# Patient Record
Sex: Male | Born: 1960 | Race: Black or African American | Hispanic: No | Smoking: Current every day smoker
Health system: Southern US, Community
[De-identification: ages and names within clinical notes are randomized; demographics above are authoritative.]

## PROBLEM LIST (undated history)

## (undated) DIAGNOSIS — L97414 Non-pressure chronic ulcer of right heel and midfoot with necrosis of bone: Secondary | ICD-10-CM

## (undated) DIAGNOSIS — F4310 Post-traumatic stress disorder, unspecified: Secondary | ICD-10-CM

## (undated) DIAGNOSIS — M869 Osteomyelitis, unspecified: Secondary | ICD-10-CM

## (undated) DIAGNOSIS — I739 Peripheral vascular disease, unspecified: Secondary | ICD-10-CM

## (undated) DIAGNOSIS — M1990 Unspecified osteoarthritis, unspecified site: Secondary | ICD-10-CM

## (undated) DIAGNOSIS — I10 Essential (primary) hypertension: Secondary | ICD-10-CM

## (undated) DIAGNOSIS — I70234 Atherosclerosis of native arteries of right leg with ulceration of heel and midfoot: Secondary | ICD-10-CM

## (undated) HISTORY — DX: Atherosclerosis of native arteries of right leg with ulceration of heel and midfoot: I70.234

## (undated) HISTORY — DX: Post-traumatic stress disorder, unspecified: F43.10

## (undated) HISTORY — DX: Non-pressure chronic ulcer of right heel and midfoot with necrosis of bone: L97.414

## (undated) HISTORY — DX: Peripheral vascular disease, unspecified: I73.9

## (undated) HISTORY — PX: MULTIPLE TOOTH EXTRACTIONS: SHX2053

## (undated) HISTORY — PX: OTHER SURGICAL HISTORY: SHX169

---

## 2009-05-21 ENCOUNTER — Ambulatory Visit: Payer: Self-pay | Admitting: Internal Medicine

## 2009-05-21 ENCOUNTER — Encounter (INDEPENDENT_AMBULATORY_CARE_PROVIDER_SITE_OTHER): Payer: Self-pay | Admitting: Adult Health

## 2009-05-21 LAB — CONVERTED CEMR LAB
ALT: 26 units/L (ref 0–53)
AST: 26 units/L (ref 0–37)
Albumin: 4.4 g/dL (ref 3.5–5.2)
Alkaline Phosphatase: 86 units/L (ref 39–117)
Amphetamine Screen, Ur: NEGATIVE
BUN: 11 mg/dL (ref 6–23)
Barbiturate Quant, Ur: NEGATIVE
Benzodiazepines.: NEGATIVE
CO2: 27 meq/L (ref 19–32)
Calcium: 9.6 mg/dL (ref 8.4–10.5)
Chloride: 106 meq/L (ref 96–112)
Cholesterol: 185 mg/dL (ref 0–200)
Cocaine Metabolites: POSITIVE — AB
Creatinine, Ser: 0.92 mg/dL (ref 0.40–1.50)
Creatinine,U: 66.4 mg/dL
Glucose, Bld: 92 mg/dL (ref 70–99)
HCV Ab: REACTIVE — AB
HDL: 67 mg/dL (ref >39)
Hep A Total Ab: POSITIVE — AB
Hep B Core Total Ab: NEGATIVE
Hep B S Ab: NEGATIVE
Hepatitis B Surface Ag: NEGATIVE
LDL Cholesterol: 103 mg/dL — ABNORMAL HIGH (ref 0–99)
Marijuana Metabolite: NEGATIVE
Methadone: NEGATIVE
Microalb, Ur: 0.59 mg/dL (ref 0.00–1.89)
Opiate Screen, Urine: NEGATIVE
Phencyclidine (PCP): NEGATIVE
Potassium: 4.5 meq/L (ref 3.5–5.3)
Propoxyphene: NEGATIVE
Sodium: 142 meq/L (ref 135–145)
Total Bilirubin: 0.7 mg/dL (ref 0.3–1.2)
Total CHOL/HDL Ratio: 2.8
Total Protein: 7.2 g/dL (ref 6.0–8.3)
Triglycerides: 76 mg/dL (ref <150)
VLDL: 15 mg/dL (ref 0–40)

## 2009-08-07 ENCOUNTER — Encounter: Admission: RE | Admit: 2009-08-07 | Discharge: 2009-08-07 | Payer: Self-pay | Admitting: Internal Medicine

## 2009-11-18 ENCOUNTER — Observation Stay (HOSPITAL_COMMUNITY): Admission: EM | Admit: 2009-11-18 | Discharge: 2009-11-19 | Payer: Self-pay | Admitting: Emergency Medicine

## 2010-08-02 ENCOUNTER — Emergency Department (HOSPITAL_COMMUNITY)
Admission: EM | Admit: 2010-08-02 | Discharge: 2010-08-02 | Payer: Self-pay | Source: Home / Self Care | Admitting: Emergency Medicine

## 2010-10-08 LAB — DIFFERENTIAL
Basophils Absolute: 0 10*3/uL (ref 0.0–0.1)
Basophils Relative: 0 % (ref 0–1)
Eosinophils Absolute: 0.3 10*3/uL (ref 0.0–0.7)
Eosinophils Relative: 3 % (ref 0–5)
Lymphocytes Relative: 13 % (ref 12–46)
Lymphs Abs: 1.1 10*3/uL (ref 0.7–4.0)
Monocytes Absolute: 0.6 10*3/uL (ref 0.1–1.0)
Monocytes Relative: 7 % (ref 3–12)
Neutro Abs: 7.1 10*3/uL (ref 1.7–7.7)
Neutrophils Relative %: 77 % (ref 43–77)

## 2010-10-08 LAB — CBC
HCT: 48.7 % (ref 39.0–52.0)
Hemoglobin: 16.9 g/dL (ref 13.0–17.0)
MCHC: 34.7 g/dL (ref 30.0–36.0)
MCV: 90.3 fL (ref 78.0–100.0)
Platelets: 190 10*3/uL (ref 150–400)
RBC: 5.4 MIL/uL (ref 4.22–5.81)
RDW: 14.2 % (ref 11.5–15.5)
WBC: 9.2 10*3/uL (ref 4.0–10.5)

## 2010-10-08 LAB — WOUND CULTURE

## 2010-10-08 LAB — ANAEROBIC CULTURE

## 2010-10-08 LAB — GRAM STAIN

## 2010-10-08 LAB — BASIC METABOLIC PANEL
BUN: 6 mg/dL (ref 6–23)
CO2: 29 mEq/L (ref 19–32)
Calcium: 9.4 mg/dL (ref 8.4–10.5)
Chloride: 103 mEq/L (ref 96–112)
Creatinine, Ser: 1.02 mg/dL (ref 0.4–1.5)
GFR calc Af Amer: >60 mL/min (ref >60)
GFR calc non Af Amer: >60 mL/min (ref >60)
Glucose, Bld: 86 mg/dL (ref 70–99)
Potassium: 3.9 mEq/L (ref 3.5–5.1)
Sodium: 138 mEq/L (ref 135–145)

## 2017-02-19 NOTE — Congregational Nurse Program (Signed)
Congregational Nurse Program Note  Date of Encounter: 02/02/2017  Past Medical History: No past medical history on file.  Encounter Details:     CNP Questionnaire - 02/02/17 1654      Patient Demographics   Is this a new or existing patient? Existing   Patient is considered a/an Not Applicable   Race African-American/Black     Patient Assistance   Location of Patient Assistance Not Applicable   Patient's financial/insurance status Low Income;Self-Pay (Uninsured)   Uninsured Patient ([employer] Card/Care Connects) Yes   Interventions Counseled to make appt. with provider;Assisted patient in making appt.;Appt. has been completed   Patient referred to apply for the following financial assistance Not Applicable   Food insecurities addressed Not Applicable   Transportation assistance No   Assistance securing medications No   Educational health offerings Navigating the healthcare system     Encounter Details   Primary purpose of visit Navigating the Healthcare System;Acute Illness/Condition Visit   Was an Emergency Department visit averted? Not Applicable   Does patient have a medical provider? No   Patient referred to Clinic   Was a mental health screening completed? (GAINS tool) No   Does patient have dental issues? No   Does patient have vision issues? No   Does your patient have an abnormal blood pressure today? No   Since previous encounter, have you referred patient for abnormal blood pressure that resulted in a new diagnosis or medication change? No   Does your patient have an abnormal blood glucose today? No   Since previous encounter, have you referred patient for abnormal blood glucose that resulted in a new diagnosis or medication change? No   Was there a life-saving intervention made? No     Continues to feel overwhelmed.  Will see Lavinia Sharps NP today.  Provided spiritual support

## 2017-02-19 NOTE — Congregational Nurse Program (Signed)
Congregational Nurse Program Note  Date of Encounter: 02/02/2017  Past Medical History: No past medical history on file.  Encounter Details:     CNP Questionnaire - 02/09/17 1700      Patient Demographics   Is this a new or existing patient? Existing   Patient is considered a/an Not Applicable   Race African-American/Black     Patient Assistance   Location of Patient Assistance Not Applicable   Patient's financial/insurance status Low Income;Self-Pay (Uninsured)   Uninsured Patient ([employer] Card/Care Connects) Yes   Interventions Counseled to make appt. with provider;Assisted patient in making appt.;Appt. has been completed   Patient referred to apply for the following financial assistance Not Applicable   Food insecurities addressed Not Applicable   Transportation assistance Yes   Type of Assistance Bus Pass Given   Assistance securing medications No   Educational health offerings Navigating the healthcare system     Encounter Details   Primary purpose of visit Navigating the Healthcare System;Acute Illness/Condition Visit   Was an Emergency Department visit averted? Not Applicable   Does patient have a medical provider? No   Patient referred to Clinic   Was a mental health screening completed? (GAINS tool) No   Does patient have dental issues? No   Does patient have vision issues? No   Does your patient have an abnormal blood pressure today? No   Since previous encounter, have you referred patient for abnormal blood pressure that resulted in a new diagnosis or medication change? No   Does your patient have an abnormal blood glucose today? No   Since previous encounter, have you referred patient for abnormal blood glucose that resulted in a new diagnosis or medication change? No   Was there a life-saving intervention made? No     Assisted client with referral to [facility], a transitional housing for released prisoners.  Bus passes provided to go the house and complete  application.

## 2017-02-19 NOTE — Congregational Nurse Program (Signed)
Congregational Nurse Program Note  Date of Encounter: 02/04/2017  Past Medical History: No past medical history on file.  Encounter Details:     CNP Questionnaire - 02/04/17 1657      Patient Demographics   Is this a new or existing patient? Existing   Patient is considered a/an Not Applicable   Race African-American/Black     Patient Assistance   Location of Patient Assistance Not Applicable   Patient's financial/insurance status Low Income;Self-Pay (Uninsured)   Uninsured Patient ([employer] Card/Care Connects) Yes   Interventions Counseled to make appt. with provider;Assisted patient in making appt.;Appt. has been completed   Patient referred to apply for the following financial assistance Not Applicable   Food insecurities addressed Not Applicable   Transportation assistance Yes   Type of Assistance Bus Pass Given   Assistance securing medications No   Educational health offerings Navigating the healthcare system     Encounter Details   Primary purpose of visit Navigating the Healthcare System;Acute Illness/Condition Visit   Was an Emergency Department visit averted? Not Applicable   Does patient have a medical provider? No   Patient referred to Clinic   Was a mental health screening completed? (GAINS tool) No   Does patient have dental issues? No   Does patient have vision issues? No   Does your patient have an abnormal blood pressure today? No   Since previous encounter, have you referred patient for abnormal blood pressure that resulted in a new diagnosis or medication change? No   Does your patient have an abnormal blood glucose today? No   Since previous encounter, have you referred patient for abnormal blood glucose that resulted in a new diagnosis or medication change? No   Was there a life-saving intervention made? No     Client saw Lavinia Sharps NP.  Medications prescribed.  Assisted client with obtaining identification.  Bus passes provided to go to the [facility]

## 2017-02-19 NOTE — Congregational Nurse Program (Signed)
Congregational Nurse Program Note  Date of Encounter: 01/30/2017  Past Medical History: No past medical history on file.  Encounter Details:     CNP Questionnaire - 01/30/17 1649      Patient Demographics   Is this a new or existing patient? New   Patient is considered a/an Not Applicable   Race African-American/Black     Patient Assistance   Location of Patient Assistance Not Applicable   Patient's financial/insurance status Low Income;Self-Pay (Uninsured)   Uninsured Patient ([employer] Card/Care Connects) Yes   Interventions Counseled to make appt. with provider;Assisted patient in making appt.;Appt. has been completed   Patient referred to apply for the following financial assistance Not Applicable   Food insecurities addressed Not Applicable   Transportation assistance Yes   Type of Assistance Bus Pass Given   Assistance securing medications No   Educational health offerings Navigating the healthcare system     Encounter Details   Primary purpose of visit Navigating the Healthcare System;Acute Illness/Condition Visit   Was an Emergency Department visit averted? Not Applicable   Does patient have a medical provider? No   Patient referred to Clinic   Was a mental health screening completed? (GAINS tool) No   Does patient have dental issues? No   Does patient have vision issues? No   Does your patient have an abnormal blood pressure today? No   Since previous encounter, have you referred patient for abnormal blood pressure that resulted in a new diagnosis or medication change? No   Does your patient have an abnormal blood glucose today? No   Since previous encounter, have you referred patient for abnormal blood glucose that resulted in a new diagnosis or medication change? No   Was there a life-saving intervention made? No     Client recently released from prison.  Having difficulty adjusting.  States he has spent most of his life in prison.  Having difficulty sleeping at  night.  States voices in his head wakes him up.  He does not feel safe.  Referred to Lavinia Sharps NP for evaluation and management.  Assisted him with the appointment.

## 2017-03-26 NOTE — Congregational Nurse Program (Signed)
Congregational Nurse Program Note  Date of Encounter: 03/02/2017  Past Medical History: No past medical history on file.  Encounter Details:     CNP Questionnaire - 03/02/17 1827      Patient Demographics   Is this a new or existing patient? Existing   Patient is considered a/an Not Applicable   Race African-American/Black     Patient Assistance   Location of Patient Assistance Not Applicable   Patient's financial/insurance status Low Income;Self-Pay (Uninsured)   Uninsured Patient ([employer] officer, trade union) Yes   Interventions Not Applicable   Patient referred to apply for the following financial assistance Not Applicable   Food insecurities addressed Not Applicable   Transportation assistance Yes   Type of Assistance Bus Pass Given   Assistance securing medications No   Educational health offerings Navigating the healthcare system     Encounter Details   Primary purpose of visit Navigating the Healthcare System;Acute Illness/Condition Visit   Was an Emergency Department visit averted? Not Applicable   Does patient have a medical provider? No   Patient referred to Clinic   Was a mental health screening completed? (GAINS tool) No   Does patient have dental issues? No   Does patient have vision issues? No   Does your patient have an abnormal blood pressure today? No   Since previous encounter, have you referred patient for abnormal blood pressure that resulted in a new diagnosis or medication change? No   Does your patient have an abnormal blood glucose today? No   Since previous encounter, have you referred patient for abnormal blood glucose that resulted in a new diagnosis or medication change? No   Was there a life-saving intervention made? No     Reporting in.  States is doing well.  He will switch providers to the [place].  Expressed excitement about the resources and the assistance in place to help him

## 2017-03-26 NOTE — Congregational Nurse Program (Signed)
Congregational Nurse Program Note  Date of Encounter: 03/13/2017  Past Medical History: No past medical history on file.  Encounter Details:     CNP Questionnaire - 03/13/17 1829      Patient Demographics   Is this a new or existing patient? Existing   Patient is considered a/an Not Applicable   Race African-American/Black     Patient Assistance   Location of Patient Assistance Not Applicable   Patient's financial/insurance status Low Income;Self-Pay (Uninsured)   Uninsured Patient ([employer] officer, trade union) Yes   Interventions Not Applicable   Patient referred to apply for the following financial assistance Not Applicable   Food insecurities addressed Not Applicable   Transportation assistance Yes   Type of Assistance Bus Pass Given   Assistance securing medications No   Educational health offerings Navigating the healthcare system;Behavioral health;Spiritual care     Encounter Details   Primary purpose of visit Navigating the Healthcare System;Spiritual Care/Support Visit   Was an Emergency Department visit averted? Not Applicable   Does patient have a medical provider? No   Patient referred to Clinic   Was a mental health screening completed? (GAINS tool) No   Does patient have dental issues? No   Does patient have vision issues? No   Does your patient have an abnormal blood pressure today? No   Since previous encounter, have you referred patient for abnormal blood pressure that resulted in a new diagnosis or medication change? No   Does your patient have an abnormal blood glucose today? No   Since previous encounter, have you referred patient for abnormal blood glucose that resulted in a new diagnosis or medication change? No   Was there a life-saving intervention made? No     Continues to do well.  Adjusting to the [facility].  States has an appointment at the [facility] in September

## 2017-03-26 NOTE — Congregational Nurse Program (Signed)
Congregational Nurse Program Note  Date of Encounter: 03/16/2017  Past Medical History: No past medical history on file.  Encounter Details:     CNP Questionnaire - 03/16/17 1831      Patient Demographics   Is this a new or existing patient? Existing   Patient is considered a/an Not Applicable   Race African-American/Black     Patient Assistance   Location of Patient Assistance Not Applicable   Patient's financial/insurance status Low Income;Self-Pay (Uninsured)   Uninsured Patient ([employer] officer, trade union) Yes   Interventions Not Applicable   Patient referred to apply for the following financial assistance Not Applicable   Food insecurities addressed Not Applicable   Transportation assistance Yes   Type of Assistance Bus Pass Given   Assistance securing medications No   Educational health offerings Navigating the healthcare system;Behavioral health;Spiritual care     Encounter Details   Primary purpose of visit Navigating the Healthcare System;Spiritual Care/Support Visit   Was an Emergency Department visit averted? Not Applicable   Does patient have a medical provider? No   Patient referred to Clinic   Was a mental health screening completed? (GAINS tool) No   Does patient have dental issues? No   Does patient have vision issues? No   Does your patient have an abnormal blood pressure today? No   Since previous encounter, have you referred patient for abnormal blood pressure that resulted in a new diagnosis or medication change? No   Does your patient have an abnormal blood glucose today? No   Since previous encounter, have you referred patient for abnormal blood glucose that resulted in a new diagnosis or medication change? No   Was there a life-saving intervention made? No     Continues to do well. Is working with a Sports coach to facilitate SSI

## 2017-03-26 NOTE — Congregational Nurse Program (Signed)
Congregational Nurse Program Note  Date of Encounter: 02/20/2017  Past Medical History: No past medical history on file.  Encounter Details:  States has been accepted into [employer] (through the [place]).  Affect much improved

## 2017-05-14 NOTE — Congregational Nurse Program (Signed)
Congregational Nurse Program Note  Date of Encounter: 04/27/2017  Past Medical History: No past medical history on file.  Encounter Details:     CNP Questionnaire - 04/27/17 1854      Patient Demographics   Is this a new or existing patient? Existing   Patient is considered a/an Not Applicable   Race African-American/Black     Patient Assistance   Location of Patient Assistance Not Applicable   Patient's financial/insurance status Low Income;Self-Pay (Uninsured)   Uninsured Patient ([employer] officer, trade union) Yes   Patient referred to apply for the following financial assistance Not Applicable   Food insecurities addressed Not Applicable   Transportation assistance Yes   Assistance securing medications No   Educational health offerings Navigating the healthcare system;Behavioral health;Spiritual care     Encounter Details   Primary purpose of visit Navigating the Healthcare System;Spiritual Care/Support Visit   Was an Emergency Department visit averted? Not Applicable   Does patient have a medical provider? No   Patient referred to Clinic   Was a mental health screening completed? (GAINS tool) No   Does patient have dental issues? No   Does patient have vision issues? No   Does your patient have an abnormal blood pressure today? No   Since previous encounter, have you referred patient for abnormal blood pressure that resulted in a new diagnosis or medication change? No   Does your patient have an abnormal blood glucose today? No   Since previous encounter, have you referred patient for abnormal blood glucose that resulted in a new diagnosis or medication change? No   Was there a life-saving intervention made? No     States is doing well.  Working on obtaining his disability.  Bus passes given to to [facility]

## 2017-07-01 NOTE — Congregational Nurse Program (Signed)
Congregational Nurse Program Note  Date of Encounter: 04/27/2017  Past Medical History: No past medical history on file.  Encounter Details:  Client dropped by the [facility] to "check in".  States is doing well.

## 2019-01-12 ENCOUNTER — Encounter (HOSPITAL_COMMUNITY): Payer: Self-pay

## 2019-01-12 ENCOUNTER — Other Ambulatory Visit: Payer: Self-pay

## 2019-01-12 ENCOUNTER — Emergency Department (HOSPITAL_COMMUNITY)
Admission: EM | Admit: 2019-01-12 | Discharge: 2019-01-12 | Discharge status: Against Medical Advice | Payer: Medicaid Other | Attending: Emergency Medicine | Admitting: Emergency Medicine

## 2019-01-12 DIAGNOSIS — Z5321 Procedure and treatment not carried out due to patient leaving prior to being seen by health care provider: Secondary | ICD-10-CM | POA: Diagnosis not present

## 2019-01-12 DIAGNOSIS — F17210 Nicotine dependence, cigarettes, uncomplicated: Secondary | ICD-10-CM | POA: Insufficient documentation

## 2019-01-12 DIAGNOSIS — I10 Essential (primary) hypertension: Secondary | ICD-10-CM | POA: Diagnosis not present

## 2019-01-12 DIAGNOSIS — N179 Acute kidney failure, unspecified: Secondary | ICD-10-CM | POA: Diagnosis not present

## 2019-01-12 DIAGNOSIS — R55 Syncope and collapse: Secondary | ICD-10-CM

## 2019-01-12 HISTORY — DX: Essential (primary) hypertension: I10

## 2019-01-12 LAB — BASIC METABOLIC PANEL
Anion gap: 14 (ref 5–15)
BUN: 11 mg/dL (ref 6–20)
CO2: 20 mmol/L — ABNORMAL LOW (ref 22–32)
Calcium: 10.1 mg/dL (ref 8.9–10.3)
Chloride: 109 mmol/L (ref 98–111)
Creatinine, Ser: 2.74 mg/dL — ABNORMAL HIGH (ref 0.61–1.24)
GFR calc Af Amer: 28 mL/min — ABNORMAL LOW (ref >60)
GFR calc non Af Amer: 24 mL/min — ABNORMAL LOW (ref >60)
Glucose, Bld: 94 mg/dL (ref 70–99)
Potassium: 4.7 mmol/L (ref 3.5–5.1)
Sodium: 143 mmol/L (ref 135–145)

## 2019-01-12 LAB — CBC WITH DIFFERENTIAL/PLATELET
Abs Immature Granulocytes: 0.03 10*3/uL (ref 0.00–0.07)
Basophils Absolute: 0 10*3/uL (ref 0.0–0.1)
Basophils Relative: 0 %
Eosinophils Absolute: 0.1 10*3/uL (ref 0.0–0.5)
Eosinophils Relative: 1 %
HCT: 48 % (ref 39.0–52.0)
Hemoglobin: 16.4 g/dL (ref 13.0–17.0)
Immature Granulocytes: 0 %
Lymphocytes Relative: 13 %
Lymphs Abs: 1.2 10*3/uL (ref 0.7–4.0)
MCH: 29 pg (ref 26.0–34.0)
MCHC: 34.2 g/dL (ref 30.0–36.0)
MCV: 84.8 fL (ref 80.0–100.0)
Monocytes Absolute: 0.6 10*3/uL (ref 0.1–1.0)
Monocytes Relative: 6 %
Neutro Abs: 7.7 10*3/uL (ref 1.7–7.7)
Neutrophils Relative %: 80 %
Platelets: ADEQUATE 10*3/uL (ref 150–400)
RBC: 5.66 MIL/uL (ref 4.22–5.81)
RDW: 13.6 % (ref 11.5–15.5)
WBC: 9.5 10*3/uL (ref 4.0–10.5)
nRBC: 0 % (ref 0.0–0.2)

## 2019-01-12 LAB — CBG MONITORING, ED: Glucose-Capillary: 76 mg/dL (ref 70–99)

## 2019-01-12 MED ORDER — SODIUM CHLORIDE 0.9 % IV BOLUS
1000.0000 mL | Freq: Once | INTRAVENOUS | Status: AC
  Administered 2019-01-12: 1000 mL via INTRAVENOUS

## 2019-01-12 NOTE — ED Notes (Signed)
Pt calling out multiple times asking for more water and for the doctor to come in. Pt has been brought more water on 3 occasions in 15 minutes. Advised pt that doctor will be in soon.

## 2019-01-12 NOTE — Discharge Instructions (Signed)
You have been seen today for loss of consciousness. You left against medical advise. Please read and follow all provided instructions.   1. Medications: usual home medications 2. Treatment: rest, drink plenty of fluids 3. Follow Up: Please follow up with your primary doctor in 1 day for discussion of your diagnoses and further evaluation after today's visit; if you do not have a primary care doctor use the resource guide provided to find one; Please return to the ER for any new or worsening symptoms. Please obtain all of your results from medical records or have your doctors office obtain the results - share them with your doctor - you should be seen at your doctors office. Call today to arrange your follow up.  ?  You should return to the ER if you develop severe or worsening symptoms.   Emergency Department Resource Guide 1) Find a Doctor and Pay Out of Pocket Although you won't have to find out who is covered by your insurance plan, it is a good idea to ask around and get recommendations. You will then need to call the office and see if the doctor you have chosen will accept you as a new patient and what types of options they offer for patients who are self-pay. Some doctors offer discounts or will set up payment plans for their patients who do not have insurance, but you will need to ask so you aren't surprised when you get to your appointment.  2) Contact Your Local Health Department Not all health departments have doctors that can see patients for sick visits, but many do, so it is worth a call to see if yours does. If you don't know where your local health department is, you can check in your phone book. The CDC also has a tool to help you locate your state's health department, and many state websites also have listings of all of their local health departments.  3) Find a [facility] If your illness is not likely to be very severe or complicated, you may want to try a walk in clinic. These  are popping up all over the country in pharmacies, drugstores, and shopping centers. They're usually staffed by nurse practitioners or physician assistants that have been trained to treat common illnesses and complaints. They're usually fairly quick and inexpensive. However, if you have serious medical issues or chronic medical problems, these are probably not your best option.  No Primary Care Doctor: Call Health Connect at  [phone] - they can help you locate a primary care doctor that  accepts your insurance, provides certain services, etc. Physician Referral Service- [phone]  Chronic Pain Problems: Organization         Address  Phone   Notes  [facility]  [phone] Patients need to be referred by their primary care doctor.   Medication Assistance: Organization         Address  Phone   Notes  [facility] Medication [facility] [address] [place], [place] [address] [phone] --Must be a resident of [facility] -- Must have NO insurance coverage whatsoever (no Medicaid/ Medicare, etc.) -- The pt. MUST have a primary care doctor that directs their care regularly and follows them in the community   Med[facility]ist  [phone]   [employer]  [phone]    Agencies that provide inexpensive medical care: Organization         Address  Phone   Notes  Gershon Mussel  [facility]  [phone]   [facility]    [phone]   [facility] [address] [place], [place] [address] [phone]   [facility] [address] [place], [place] [phone]   [facility]    [phone]   [facility]    [phone]   [facility]  [address], [place] Phone:  [phone], Fax:  [phone] Hours of Operation:  9 am - 6 pm, M-F.  Also accepts Medicaid/Medicare and self-pay.  [facility]  [address], [place] Phone: [phone], Fax: [phone]. Hours of Operation:  8:30 am - 5:30 pm, M-F.  Also accepts Medicaid and self-pay.  [facility] [place] [address] [place] Phone: [phone]   [facility] [address] Natasha Bence [place], [place] [phone], Ext. [phone] Mondays & Thursdays: 7-9 AM.  First 15 patients are seen on a first come, first serve basis.    Medicaid-accepting [facility] Providers:  Organization         Address  Phone   Notes  [facility] [address], [place] [phone] Also accepts self-pay patients.  [facility] [address] Laurell Josephs [place], [place]  [phone]   [facility] [address], [place] [phone]   [facility] [address] [place] [phone]   Renaye Rakers [address], [place]   [phone] Only accepts [place] patients after they have their name applied to their card.   Self-Pay (no insurance) in [facility]:  Organization         Address  Phone   Notes  Sickle Cell Patients, [facility] [address] [place], [place] [phone]   [facility] [address] [place], [place] [phone]   [facility]  [address], [place] [phone]   [facility]/Dr. Osei-Bonsu  [address] [place] or [address], [place] [phone] Phone number for both [place] and [place] locations is the same.  [facility] [address], [place] [phone]   [facility] [address], [place] or [address] [phone]   [facility] [address], [place] [phone], phone; [phone], fax Sees patients 1st and 3rd Saturday  of every month.  Must not qualify for public or private insurance (i.e. Medicaid, Medicare, [employer] Choice, Veterans' Benefits)  Household income should be no more than 200% of the poverty level The clinic cannot treat you if you are pregnant or think you are pregnant  Sexually transmitted diseases are not treated at the clinic.

## 2019-01-12 NOTE — ED Notes (Signed)
Gave pt sandwich and water per request.

## 2019-01-12 NOTE — ED Notes (Signed)
Pt ambulated unassisted out of department after speaking with EDP about choice to leave AMA

## 2019-01-12 NOTE — ED Triage Notes (Signed)
Pt arrives via [facility] after working outside on his car and had syncope episode. Pt reports he was feeling dizziness and then LOC. Per EMS pts BP en route 88/60. On arrival to ED pts BP 106/70. Pt c/o of cramping in L leg. Pt received 500 ml bolus of NS

## 2019-01-12 NOTE — ED Notes (Signed)
Pt called out and asked "if I can sign myself out". RN informed.

## 2019-01-12 NOTE — ED Provider Notes (Signed)
[facility]ENC[facility] Provider Note   CSN: [id number] Arrival date & time: 01/12/19  1813    History   Chief Complaint Chief Complaint  Patient presents with  . Loss of Consciousness    HPI Mitchell Richard is a 58 y.o. male with a PMH of HTN presents after an episode of LOC onset 30 minutes ago. Patient arrived via EMS. EMS reports patient was hypotensive at 88/60 upon arrival and provided 500mL IVF. Patient reports he was working outside on his brother's car when he stood up and felt lightheaded. Patient reports he had a brief syncopal episode and fell to the ground. Patient denies chest pain, shortness of breath, nausea, vomiting, abdominal pain, diarrhea, sick exposures or recent travel. Patient states he has not eaten today or drank water.      HPI  Past Medical History:  Diagnosis Date  . Hypertension     There are no active problems to display for this patient.   History reviewed. No pertinent surgical history.      Home Medications    Prior to Admission medications   Not on File    Family History History reviewed. No pertinent family history.  Social History Social History   Tobacco Use  . Smoking status: Current Every Day Smoker    Types: Cigarettes  . Smokeless tobacco: Never Used  Substance Use Topics  . Alcohol use: Never    Frequency: Never  . Drug use: Not Currently     Allergies   Patient has no allergy information on record.   Review of Systems Review of Systems  Constitutional: Negative for activity change, appetite change, chills, diaphoresis, fatigue, fever and unexpected weight change.  HENT: Negative for congestion, rhinorrhea and sore throat.   Eyes: Negative for photophobia and visual disturbance.  Respiratory: Negative for cough, chest tightness and shortness of breath.   Cardiovascular: Negative for chest pain, palpitations and leg swelling.  Gastrointestinal: Negative for abdominal pain, blood in  stool, diarrhea, nausea and vomiting.  Endocrine: Negative for cold intolerance and heat intolerance.  Genitourinary: Negative for dysuria.  Musculoskeletal: Negative for myalgias and neck pain.  Skin: Negative for wound.  Allergic/Immunologic: Negative for immunocompromised state.  Neurological: Positive for dizziness, syncope and light-headedness. Negative for tremors, seizures, facial asymmetry, speech difficulty, weakness, numbness and headaches.  Psychiatric/Behavioral: Negative for confusion, dysphoric mood and sleep disturbance. The patient is not nervous/anxious.      Physical Exam Updated Vital Signs BP 111/87   Pulse 83   Temp 97.9 F (36.6 C) (Oral)   Resp 18   SpO2 98%   Physical Exam Vitals signs and nursing note reviewed.  Constitutional:      General: He is not in acute distress.    Appearance: He is well-developed. He is not diaphoretic.  HENT:     Head: Normocephalic and atraumatic.     Right Ear: Tympanic membrane, ear canal and external ear normal.     Left Ear: Tympanic membrane, ear canal and external ear normal.     Nose: Nose normal. No congestion or rhinorrhea.     Mouth/Throat:     Mouth: Mucous membranes are dry.     Pharynx: No posterior oropharyngeal erythema.  Eyes:     Extraocular Movements: Extraocular movements intact.     Conjunctiva/sclera: Conjunctivae normal.     Pupils: Pupils are equal, round, and reactive to light.  Neck:     Musculoskeletal: Normal range of motion.  Cardiovascular:  Rate and Rhythm: Normal rate and regular rhythm.     Heart sounds: Normal heart sounds. No murmur. No friction rub. No gallop.   Pulmonary:     Effort: Pulmonary effort is normal. No respiratory distress.     Breath sounds: Normal breath sounds. No wheezing or rales.  Abdominal:     Palpations: Abdomen is soft.     Tenderness: There is no abdominal tenderness.  Musculoskeletal: Normal range of motion.  Skin:    General: Skin is warm.      Findings: No erythema or rash.  Neurological:     Mental Status: He is alert.    Mental Status:  Alert, oriented, thought content appropriate, able to give a coherent history. Speech fluent without evidence of aphasia. Able to follow 2 step commands without difficulty.  Cranial Nerves:  II:  Peripheral visual fields grossly normal, pupils equal, round, reactive to light III,IV, VI: ptosis not present, extra-ocular motions intact bilaterally  V,VII: smile symmetric, facial light touch sensation equal VIII: hearing grossly normal to voice  IX,X: symmetric elevation of soft palate, uvula elevates symmetrically  XI: bilateral shoulder shrug symmetric and strong XII: midline tongue extension without fassiculations Motor:  Normal tone. 5/5 in upper and lower extremities bilaterally including strong and equal grip strength and dorsiflexion/plantar flexion Sensory: Pinprick and light touch normal in all extremities.  Deep Tendon Reflexes: 2+ and symmetric in the biceps and patella Cerebellar: normal finger-to-nose with bilateral upper extremities Gait: normal gait and balance.  Negative pronator drift. Negative Romberg sign. CV: distal pulses palpable throughout     ED Treatments / Results  Labs (all labs ordered are listed, but only abnormal results are displayed) Labs Reviewed  BASIC METABOLIC PANEL - Abnormal; Notable for the following components:      Result Value   CO2 20 (*)    Creatinine, Ser 2.74 (*)    GFR calc non Af Amer 24 (*)    GFR calc Af Amer 28 (*)    All other components within normal limits  CBC WITH DIFFERENTIAL/PLATELET  CBG MONITORING, ED    EKG None    Date: 01/12/2019  Rate: 74  Rhythm: normal sinus rhythm  QRS Axis: normal  Intervals: normal  ST/T Wave abnormalities: Borderline diffuse T abnormalities  Conduction Disutrbances:none  Narrative Interpretation:   Old EKG Reviewed: none available  I have personally reviewed the EKG tracing and agree  with the computerized printout as noted.    Radiology No results found.  Procedures Procedures (including critical care time)  Medications Ordered in ED Medications  sodium chloride 0.9 % bolus 1,000 mL (0 mLs Intravenous Stopped 01/12/19 2111)     Initial Impression / Assessment and Plan / ED Course  I have reviewed the triage vital signs and the nursing notes.  Pertinent labs & imaging results that were available during my care of the patient were reviewed by me and considered in my medical decision making (see chart for details).  Clinical Course as of Jan 12 2231  Wed Jan 12, 2019  1922 Creatinine elevated at 2.74. This appears to be more elevated than previous value 9 years ago. Will provide IVF. Discussed with patient. Patient states he does not want to stay in the hospital, but will follow up with PCP tomorrow.   Creatinine(!): 2.74 [AH]    Clinical Course User Index [AH] Leretha Dykes, PA-C      Patient presents with a syncopal episode. Patient without arrhythmia or tachycardia while here  in the department.  Patient without history of congestive heart failure, normal hematocrit, no shortness of breath and systolic blood pressure greater than 90. Patient had positive orthostatic vitals. IVF provided. Possibility of recurrent syncope has been discussed. I discussed reasons to avoid driving until cardiology/PCP followup and other safety prevention including use of ladders and working at heights. Patient's creatinine was significantly elevated in the ER. No recent previous values available for comparison. Last value was 9 years ago and it was within normal limits. Discussed patient should be admitted for acute kidney injury, but patient refused to be admitted and requested to be discharged. Discussed this would be leaving against medical advise and patient states he understands. Advised patient to follow up with PCP tomorrow.  The patient and I discussed the nature and purpose,  risks and benefits, as well as, the alternatives of treatment. Time was given to allow the opportunity to ask questions and consider options. After the discussion, the patient decided to refuse the offerred treatment. The patient was informed that refusal could lead to, but was not limited to, death, permanent disability, or severe pain. If present, I asked the relatives and/or significant others to dissuade the patient without success. Prior to refusing, I determined that the patient had the capacity to make their decision and understood the consequences of that decision. After refusal, I made every reasonable effort to treat them to the best of my ability.  The patient was notified that they may return to the emergency department at any time for further treatment.    I have discussed my concerns as his provider and the possibility that this may worsen. I have specifically discussed that without further evaluation I cannot guarantee there is not a life threatening event occuring.  Pt is A&Ox4, his own POA and states understanding of my concerns and the possible consequences.  I have made pt aware that this is an [facility] discharge, but he may return at any time for further evaluation and treatment.  BP 111/87   Pulse 83   Temp 97.9 F (36.6 C) (Oral)   Resp 18   SpO2 98%    Findings and plan of care discussed with supervising physician Dr. Lita Mains.  Final Clinical Impressions(s) / ED Diagnoses   Final diagnoses:  AKI (acute kidney injury) ([place])  Syncope, unspecified syncope type    ED Discharge Orders    None       Julienne Kass 01/12/19 2232    Julianne Rice, MD 01/12/19 2317

## 2019-02-10 ENCOUNTER — Emergency Department (HOSPITAL_COMMUNITY): Payer: Medicaid Other

## 2019-02-10 ENCOUNTER — Other Ambulatory Visit: Payer: Self-pay

## 2019-02-10 ENCOUNTER — Encounter (HOSPITAL_COMMUNITY): Payer: Self-pay | Admitting: Emergency Medicine

## 2019-02-10 ENCOUNTER — Emergency Department (HOSPITAL_COMMUNITY)
Admission: EM | Admit: 2019-02-10 | Discharge: 2019-02-10 | Disposition: A | Discharge status: Home / Self Care | Payer: Medicaid Other | Attending: Emergency Medicine | Admitting: Emergency Medicine

## 2019-02-10 DIAGNOSIS — F17210 Nicotine dependence, cigarettes, uncomplicated: Secondary | ICD-10-CM | POA: Insufficient documentation

## 2019-02-10 DIAGNOSIS — E876 Hypokalemia: Secondary | ICD-10-CM | POA: Diagnosis not present

## 2019-02-10 DIAGNOSIS — I10 Essential (primary) hypertension: Secondary | ICD-10-CM | POA: Diagnosis not present

## 2019-02-10 DIAGNOSIS — R55 Syncope and collapse: Secondary | ICD-10-CM

## 2019-02-10 LAB — CBC
HCT: 44.6 % (ref 39.0–52.0)
Hemoglobin: 15 g/dL (ref 13.0–17.0)
MCH: 29 pg (ref 26.0–34.0)
MCHC: 33.6 g/dL (ref 30.0–36.0)
MCV: 86.1 fL (ref 80.0–100.0)
Platelets: 238 10*3/uL (ref 150–400)
RBC: 5.18 MIL/uL (ref 4.22–5.81)
RDW: 14.2 % (ref 11.5–15.5)
WBC: 7.2 10*3/uL (ref 4.0–10.5)
nRBC: 0 % (ref 0.0–0.2)

## 2019-02-10 LAB — BASIC METABOLIC PANEL
Anion gap: 13 (ref 5–15)
BUN: 16 mg/dL (ref 6–20)
CO2: 20 mmol/L — ABNORMAL LOW (ref 22–32)
Calcium: 9.1 mg/dL (ref 8.9–10.3)
Chloride: 107 mmol/L (ref 98–111)
Creatinine, Ser: 1.75 mg/dL — ABNORMAL HIGH (ref 0.61–1.24)
GFR calc Af Amer: 48 mL/min — ABNORMAL LOW (ref >60)
GFR calc non Af Amer: 41 mL/min — ABNORMAL LOW (ref >60)
Glucose, Bld: 100 mg/dL — ABNORMAL HIGH (ref 70–99)
Potassium: 2.9 mmol/L — ABNORMAL LOW (ref 3.5–5.1)
Sodium: 140 mmol/L (ref 135–145)

## 2019-02-10 LAB — TROPONIN I (HIGH SENSITIVITY)
Troponin I (High Sensitivity): 5 ng/L (ref <18)
Troponin I (High Sensitivity): 6 ng/L (ref <18)

## 2019-02-10 MED ORDER — MORPHINE SULFATE (PF) 4 MG/ML IV SOLN
4.0000 mg | Freq: Once | INTRAVENOUS | Status: AC
  Administered 2019-02-10: 4 mg via INTRAVENOUS
  Filled 2019-02-10: qty 1

## 2019-02-10 MED ORDER — POTASSIUM CHLORIDE CRYS ER 20 MEQ PO TBCR
60.0000 meq | EXTENDED_RELEASE_TABLET | Freq: Once | ORAL | Status: AC
  Administered 2019-02-10: 60 meq via ORAL
  Filled 2019-02-10: qty 3

## 2019-02-10 MED ORDER — MAGNESIUM OXIDE 400 (241.3 MG) MG PO TABS
800.0000 mg | ORAL_TABLET | Freq: Once | ORAL | Status: AC
  Administered 2019-02-10: 800 mg via ORAL
  Filled 2019-02-10: qty 2

## 2019-02-10 MED ORDER — SODIUM CHLORIDE 0.9 % IV BOLUS
1000.0000 mL | Freq: Once | INTRAVENOUS | Status: AC
  Administered 2019-02-10: 1000 mL via INTRAVENOUS

## 2019-02-10 NOTE — ED Triage Notes (Signed)
Pt was walking down [address] to the store, walked into the [employer] and laid down in the floor, the store clerk reports he did not lose consciousness.  Reports right arm and left arm are aching. EMS reports pt has multiple complaints today. Pt takes lipitor and HCTZ. Pt A&O x4. Pt was here a few months ago for similar.

## 2019-02-10 NOTE — ED Notes (Signed)
Pt is sinus brady-NSR on monitor

## 2019-02-15 NOTE — ED Provider Notes (Signed)
[facility] EMERGENCY DEPARTMENT Provider Note   CSN: [id number] Arrival date & time: 02/10/19  1400     History   Chief Complaint Chief Complaint  Patient presents with  . Near Syncope    HPI Mitchell Richard is a 58 y.o. male.     HPI   58yM with syncope. Says he was walking and began to feel overheated. Went into an [employer] and laid down. Clerks called EMS. Did have LOC. Feeling better but achy all over. No dyspnea. No cough. No unusual leg pain or swelling.   Past Medical History:  Diagnosis Date  . Hypertension     There are no active problems to display for this patient.   History reviewed. No pertinent surgical history.      Home Medications    Prior to Admission medications   Not on File    Family History History reviewed. No pertinent family history.  Social History Social History   Tobacco Use  . Smoking status: Current Every Day Smoker    Packs/day: 1.00    Types: Cigarettes  . Smokeless tobacco: Never Used  Substance Use Topics  . Alcohol use: Never    Frequency: Never  . Drug use: Yes    Types: Cocaine, Marijuana    Comment: 2-3 days ago for cocaine      Allergies   Patient has no allergy information on record.   Review of Systems Review of Systems  All systems reviewed and negative, other than as noted in HPI.  Physical Exam Updated Vital Signs BP (!) 132/91 (BP Location: Right Arm)   Pulse (!) 51   Temp 98 F (36.7 C) (Oral)   Resp 18   SpO2 94%   Physical Exam Vitals signs and nursing note reviewed.  Constitutional:      General: He is not in acute distress.    Appearance: He is well-developed.  HENT:     Head: Normocephalic and atraumatic.  Eyes:     General:        Right eye: No discharge.        Left eye: No discharge.     Conjunctiva/sclera: Conjunctivae normal.  Neck:     Musculoskeletal: Neck supple.  Cardiovascular:     Rate and Rhythm: Normal rate and regular rhythm.     Heart  sounds: Normal heart sounds. No murmur. No friction rub. No gallop.   Pulmonary:     Effort: Pulmonary effort is normal. No respiratory distress.     Breath sounds: Normal breath sounds.  Abdominal:     General: There is no distension.     Palpations: Abdomen is soft.     Tenderness: There is no abdominal tenderness.  Musculoskeletal:        General: No tenderness.  Skin:    General: Skin is warm and dry.  Neurological:     Mental Status: He is alert.  Psychiatric:        Behavior: Behavior normal.        Thought Content: Thought content normal.      ED Treatments / Results  Labs (all labs ordered are listed, but only abnormal results are displayed) Labs Reviewed  BASIC METABOLIC PANEL - Abnormal; Notable for the following components:      Result Value   Potassium 2.9 (*)    CO2 20 (*)    Glucose, Bld 100 (*)    Creatinine, Ser 1.75 (*)    GFR calc non Af Denyse Dago  41 (*)    GFR calc Af Amer 48 (*)    All other components within normal limits  CBC  TROPONIN I (HIGH SENSITIVITY)  TROPONIN I (HIGH SENSITIVITY)    EKG EKG Interpretation  Date/Time:  Thursday February 10 2019 14:13:54 EDT Ventricular Rate:  57 PR Interval:    QRS Duration: 88 QT Interval:  473 QTC Calculation: 461 R Axis:   10 Text Interpretation:  Sinus rhythm Short PR interval Confirmed by Virgel Manifold [phone]) on 02/10/2019 2:28:10 PM   Radiology No results found.  Procedures Procedures (including critical care time)  Medications Ordered in ED Medications  sodium chloride 0.9 % bolus 1,000 mL (0 mLs Intravenous Stopped 02/10/19 1820)  potassium chloride SA (K-DUR) CR tablet 60 mEq (60 mEq Oral Given 02/10/19 1547)  magnesium oxide (MAG-OX) tablet 800 mg (800 mg Oral Given 02/10/19 1548)  morphine 4 MG/ML injection 4 mg (4 mg Intravenous Given 02/10/19 1548)     Initial Impression / Assessment and Plan / ED Course  I have reviewed the triage vital signs and the nursing notes.  Pertinent labs &  imaging results that were available during my care of the patient were reviewed by me and considered in my medical decision making (see chart for details).        58yM with near syncope. Now feeling better. HypoK. Supplemented. Ambulated in ED.   Final Clinical Impressions(s) / ED Diagnoses   Final diagnoses:  Hypokalemia  Syncope, unspecified syncope type    ED Discharge Orders    None       Virgel Manifold, MD 02/15/19 1954

## 2019-04-02 ENCOUNTER — Ambulatory Visit (HOSPITAL_COMMUNITY)
Admission: EM | Admit: 2019-04-02 | Discharge: 2019-04-02 | Discharge status: Against Medical Advice | Payer: Medicaid Other

## 2019-04-02 NOTE — ED Notes (Signed)
Pt told patient access that he did not want to wait around and he would return at 1500.  Pt told we could not hold his spot for him.  Pt chose to leave anyway

## 2019-04-04 ENCOUNTER — Emergency Department (HOSPITAL_COMMUNITY): Payer: Medicaid Other

## 2019-04-04 ENCOUNTER — Other Ambulatory Visit: Payer: Self-pay

## 2019-04-04 ENCOUNTER — Encounter (HOSPITAL_COMMUNITY): Payer: Self-pay | Admitting: *Deleted

## 2019-04-04 ENCOUNTER — Emergency Department (HOSPITAL_COMMUNITY)
Admission: EM | Admit: 2019-04-04 | Discharge: 2019-04-04 | Disposition: A | Discharge status: Home / Self Care | Payer: Medicaid Other | Attending: Emergency Medicine | Admitting: Emergency Medicine

## 2019-04-04 DIAGNOSIS — I10 Essential (primary) hypertension: Secondary | ICD-10-CM | POA: Diagnosis not present

## 2019-04-04 DIAGNOSIS — S91001A Unspecified open wound, right ankle, initial encounter: Secondary | ICD-10-CM | POA: Insufficient documentation

## 2019-04-04 DIAGNOSIS — X58XXXA Exposure to other specified factors, initial encounter: Secondary | ICD-10-CM | POA: Insufficient documentation

## 2019-04-04 DIAGNOSIS — Y999 Unspecified external cause status: Secondary | ICD-10-CM | POA: Insufficient documentation

## 2019-04-04 DIAGNOSIS — Y939 Activity, unspecified: Secondary | ICD-10-CM | POA: Insufficient documentation

## 2019-04-04 DIAGNOSIS — Y929 Unspecified place or not applicable: Secondary | ICD-10-CM | POA: Diagnosis not present

## 2019-04-04 DIAGNOSIS — F17210 Nicotine dependence, cigarettes, uncomplicated: Secondary | ICD-10-CM | POA: Diagnosis not present

## 2019-04-04 MED ORDER — CIPROFLOXACIN HCL 500 MG PO TABS: 500.0000 mg | ORAL_TABLET | Freq: Two times a day (BID) | ORAL | 0 refills | Status: AC

## 2019-04-04 MED ORDER — ACETAMINOPHEN 325 MG PO TABS
650.0000 mg | ORAL_TABLET | Freq: Once | ORAL | Status: AC
  Administered 2019-04-04: 650 mg via ORAL
  Filled 2019-04-04: qty 2

## 2019-04-04 MED ORDER — CIPROFLOXACIN HCL 500 MG PO TABS
500.0000 mg | ORAL_TABLET | Freq: Once | ORAL | Status: AC
  Administered 2019-04-04: 500 mg via ORAL
  Filled 2019-04-04: qty 1

## 2019-04-04 NOTE — ED Triage Notes (Signed)
The pt had an old iinjury on his rt ankle that reopened  He injujred it 3 days ago he has a device on his rt ankle for house arrest  No active bleeding

## 2019-04-04 NOTE — ED Notes (Signed)
Pt verbalized understanding of d/c instructions and has no further questions, VSS, NAD.  

## 2019-04-04 NOTE — ED Notes (Signed)
Pt given food and beverage per [place], [place]
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
## 2019-04-04 NOTE — ED Notes (Signed)
Patient transported to X-ray 

## 2019-04-04 NOTE — Discharge Instructions (Addendum)
You have been seen today for ankle wound. Please read and follow all provided instructions. Return to the emergency room for worsening condition or new concerning symptoms.  Watch for signs of infection including surrounding redness, fever, more pus draining.  1. Medications:  Prescription has been printed for Ciprofloxacin.  This is an antibiotic.  It is important that you take this because you have an infection in your ankle. Continue usual home medications Take medications as prescribed. Please review all of the medicines and only take them if you do not have an allergy to them.   2. Treatment: rest, drink plenty of fluids.  Clean the wound daily with soap and water.  3. Follow Up: Please follow up with your primary doctor in 2-5 days for discussion of your diagnoses and further evaluation after today's visit; Call today to arrange your follow up.  -A referral has also been sent for the wound care center.  You need to have further care and evaluation of your wound done by the wound care center.  They will call you to set up an appointment.  If you do not hear from them within 1 week the number is listed in your discharge paperwork and you can call them set up an appointment. -If you want to follow-up and have wound care done by your regular doctor that is okay also.  It is also a possibility that you have an allergic reaction to any of the medicines that you have been prescribed - Everybody reacts differently to medications and while MOST people have no trouble with most medicines, you may have a reaction such as nausea, vomiting, rash, swelling, shortness of breath. If this is the case, please stop taking the medicine immediately and contact your physician.  ?

## 2019-04-12 ENCOUNTER — Ambulatory Visit: Payer: Medicaid Other | Admitting: Physician Assistant

## 2019-04-20 ENCOUNTER — Encounter (HOSPITAL_COMMUNITY): Payer: Self-pay | Admitting: Emergency Medicine

## 2019-04-20 ENCOUNTER — Emergency Department (HOSPITAL_COMMUNITY)
Admission: EM | Admit: 2019-04-20 | Discharge: 2019-04-21 | Discharge status: Against Medical Advice | Payer: Medicaid Other | Attending: Emergency Medicine | Admitting: Emergency Medicine

## 2019-04-20 ENCOUNTER — Other Ambulatory Visit: Payer: Self-pay

## 2019-04-20 DIAGNOSIS — Y929 Unspecified place or not applicable: Secondary | ICD-10-CM | POA: Insufficient documentation

## 2019-04-20 DIAGNOSIS — Z5321 Procedure and treatment not carried out due to patient leaving prior to being seen by health care provider: Secondary | ICD-10-CM | POA: Insufficient documentation

## 2019-04-20 DIAGNOSIS — Y939 Activity, unspecified: Secondary | ICD-10-CM | POA: Diagnosis not present

## 2019-04-20 DIAGNOSIS — Y999 Unspecified external cause status: Secondary | ICD-10-CM | POA: Insufficient documentation

## 2019-04-20 DIAGNOSIS — X58XXXA Exposure to other specified factors, initial encounter: Secondary | ICD-10-CM | POA: Diagnosis not present

## 2019-04-20 DIAGNOSIS — S91301A Unspecified open wound, right foot, initial encounter: Secondary | ICD-10-CM | POA: Diagnosis present

## 2019-04-20 LAB — CBC WITH DIFFERENTIAL/PLATELET
Abs Immature Granulocytes: 0.03 10*3/uL (ref 0.00–0.07)
Basophils Absolute: 0 10*3/uL (ref 0.0–0.1)
Basophils Relative: 0 %
Eosinophils Absolute: 0.1 10*3/uL (ref 0.0–0.5)
Eosinophils Relative: 1 %
HCT: 39 % (ref 39.0–52.0)
Hemoglobin: 13.8 g/dL (ref 13.0–17.0)
Immature Granulocytes: 0 %
Lymphocytes Relative: 15 %
Lymphs Abs: 1.4 10*3/uL (ref 0.7–4.0)
MCH: 29.9 pg (ref 26.0–34.0)
MCHC: 35.4 g/dL (ref 30.0–36.0)
MCV: 84.4 fL (ref 80.0–100.0)
Monocytes Absolute: 0.5 10*3/uL (ref 0.1–1.0)
Monocytes Relative: 5 %
Neutro Abs: 7.2 10*3/uL (ref 1.7–7.7)
Neutrophils Relative %: 79 %
Platelets: 322 10*3/uL (ref 150–400)
RBC: 4.62 MIL/uL (ref 4.22–5.81)
RDW: 14.6 % (ref 11.5–15.5)
WBC: 9.2 10*3/uL (ref 4.0–10.5)
nRBC: 0 % (ref 0.0–0.2)

## 2019-04-20 NOTE — ED Triage Notes (Signed)
Patient reports chronic  infected/draining wound at right posterior heel onset last month unrelieved by antibiotic, denies fever or chills .

## 2019-04-21 LAB — COMPREHENSIVE METABOLIC PANEL
ALT: 17 U/L (ref 0–44)
AST: 20 U/L (ref 15–41)
Albumin: 3.5 g/dL (ref 3.5–5.0)
Alkaline Phosphatase: 83 U/L (ref 38–126)
Anion gap: 10 (ref 5–15)
BUN: 8 mg/dL (ref 6–20)
CO2: 21 mmol/L — ABNORMAL LOW (ref 22–32)
Calcium: 8.9 mg/dL (ref 8.9–10.3)
Chloride: 108 mmol/L (ref 98–111)
Creatinine, Ser: 1.13 mg/dL (ref 0.61–1.24)
GFR calc Af Amer: >60 mL/min (ref >60)
GFR calc non Af Amer: >60 mL/min (ref >60)
Glucose, Bld: 121 mg/dL — ABNORMAL HIGH (ref 70–99)
Potassium: 3.4 mmol/L — ABNORMAL LOW (ref 3.5–5.1)
Sodium: 139 mmol/L (ref 135–145)
Total Bilirubin: 0.2 mg/dL — ABNORMAL LOW (ref 0.3–1.2)
Total Protein: 7 g/dL (ref 6.5–8.1)

## 2019-04-21 NOTE — ED Notes (Signed)
No answer x2 

## 2019-04-21 NOTE — ED Notes (Signed)
No answer x3 and pt not visible in lobby

## 2019-04-21 NOTE — ED Notes (Signed)
No answer for vitals recheck x1 

## 2019-04-27 ENCOUNTER — Telehealth (HOSPITAL_COMMUNITY): Payer: Self-pay | Admitting: Emergency Medicine

## 2019-04-27 ENCOUNTER — Encounter (HOSPITAL_COMMUNITY): Payer: Self-pay

## 2019-04-27 ENCOUNTER — Ambulatory Visit (HOSPITAL_COMMUNITY)
Admission: EM | Admit: 2019-04-27 | Discharge: 2019-04-27 | Disposition: A | Discharge status: Home / Self Care | Payer: Medicaid Other | Attending: Emergency Medicine | Admitting: Emergency Medicine

## 2019-04-27 ENCOUNTER — Other Ambulatory Visit: Payer: Self-pay

## 2019-04-27 DIAGNOSIS — S91001D Unspecified open wound, right ankle, subsequent encounter: Secondary | ICD-10-CM | POA: Diagnosis not present

## 2019-04-27 MED ORDER — IBUPROFEN 800 MG PO TABS: 800.0000 mg | ORAL_TABLET | Freq: Three times a day (TID) | ORAL | 0 refills | Status: AC | PRN

## 2019-04-27 MED ORDER — IBUPROFEN 800 MG PO TABS: 800.0000 mg | ORAL_TABLET | Freq: Three times a day (TID) | ORAL | 0 refills | Status: DC | PRN

## 2019-04-27 MED ORDER — DOXYCYCLINE HYCLATE 100 MG PO CAPS: 100.0000 mg | ORAL_CAPSULE | Freq: Two times a day (BID) | ORAL | 0 refills | Status: DC

## 2019-04-27 NOTE — Discharge Instructions (Signed)
Clean your wound gently twice a day with soap and water.    Take the antibiotic as prescribed.    Call the wound care center to make the soonest available appointment.

## 2019-04-27 NOTE — Telephone Encounter (Signed)
Discussion with patient about pharmacy selection.

## 2019-04-27 NOTE — Telephone Encounter (Signed)
Pharmacy choice changed at request of patient.

## 2019-04-27 NOTE — ED Provider Notes (Addendum)
[facility]    CSN: [id number] Arrival date & time: 04/27/19  6720      History   Chief Complaint Chief Complaint  Patient presents with  . Wound Check    Right Foot    HPI Mitchell Richard is a 58 y.o. male.   Patient presents with ongoing redness, swelling, tenderness to his right posterior ankle from a wound.  He was seen in the ED on 04/04/2019 and started on Cipro; he was referred to a wound care center but did not follow-up.  He denies fever or chills.  No current drainage from the wound although he says it has drained "pus" previously.  The history is provided by the patient.    Past Medical History:  Diagnosis Date  . Hypertension     There are no active problems to display for this patient.   History reviewed. No pertinent surgical history.     Home Medications    Prior to Admission medications   Medication Sig Start Date End Date Taking? Authorizing Provider  doxycycline (VIBRAMYCIN) 100 MG capsule Take 1 capsule (100 mg total) by mouth 2 (two) times daily. 04/27/19   Mickie Bail, NP    Family History Family History  Family history unknown: Yes    Social History Social History   Tobacco Use  . Smoking status: Current Every Day Smoker    Packs/day: 1.00    Types: Cigarettes  . Smokeless tobacco: Never Used  Substance Use Topics  . Alcohol use: Never    Frequency: Never  . Drug use: Yes    Types: Cocaine, Marijuana    Comment: 2-3 days ago for cocaine      Allergies   Patient has no known allergies.   Review of Systems Review of Systems  Constitutional: Negative for chills and fever.  HENT: Negative for ear pain and sore throat.   Eyes: Negative for pain and visual disturbance.  Respiratory: Negative for cough and shortness of breath.   Cardiovascular: Negative for chest pain and palpitations.  Gastrointestinal: Negative for abdominal pain and vomiting.  Genitourinary: Negative for dysuria and hematuria.  Musculoskeletal:  Negative for arthralgias and back pain.  Skin: Positive for wound. Negative for color change and rash.  Neurological: Negative for seizures and syncope.  All other systems reviewed and are negative.    Physical Exam Triage Vital Signs ED Triage Vitals  Enc Vitals Group     BP 04/27/19 0826 128/90     Pulse Rate 04/27/19 0826 74     Resp 04/27/19 0826 17     Temp 04/27/19 0826 98.3 F (36.8 C)     Temp Source 04/27/19 0826 Oral     SpO2 04/27/19 0826 100 %     Weight --      Height --      Head Circumference --      Peak Flow --      Pain Score 04/27/19 0827 8     Pain Loc --      Pain Edu? --      Excl. in GC? --    No data found.  Updated Vital Signs BP 128/90 (BP Location: Right Arm)   Pulse 74   Temp 98.3 F (36.8 C) (Oral)   Resp 17   SpO2 100%   Visual Acuity Right Eye Distance:   Left Eye Distance:   Bilateral Distance:    Right Eye Near:   Left Eye Near:    Bilateral Near:  Physical Exam Vitals signs and nursing note reviewed.  Constitutional:      Appearance: He is well-developed.  HENT:     Head: Normocephalic and atraumatic.  Eyes:     Conjunctiva/sclera: Conjunctivae normal.  Neck:     Musculoskeletal: Neck supple.  Cardiovascular:     Rate and Rhythm: Normal rate and regular rhythm.     Heart sounds: No murmur.  Pulmonary:     Effort: Pulmonary effort is normal. No respiratory distress.     Breath sounds: Normal breath sounds.  Abdominal:     Palpations: Abdomen is soft.     Tenderness: There is no abdominal tenderness.  Skin:    General: Skin is warm and dry.     Findings: Lesion present.     Comments: Open wound on right posterior ankle: see picture for details.    Neurological:     Mental Status: He is alert.          UC Treatments / Results  Labs (all labs ordered are listed, but only abnormal results are displayed) Labs Reviewed - No data to display  EKG   Radiology No results found.  Procedures Procedures  (including critical care time)  Medications Ordered in UC Medications - No data to display  Initial Impression / Assessment and Plan / UC Course  I have reviewed the triage vital signs and the nursing notes.  Pertinent labs & imaging results that were available during my care of the patient were reviewed by me and considered in my medical decision making (see chart for details).    Right ankle wound.  Treating with doxycycline.  Ibuprofen as needed for pain.  Instructed patient in wound care and signs of infection.  Strict instructions given to patient to call the wound care center to make the soonest possible appointment.  Patient agrees to plan of care.     Final Clinical Impressions(s) / UC Diagnoses   Final diagnoses:  Wound of right ankle, subsequent encounter     Discharge Instructions     Clean your wound gently twice a day with soap and water.    Take the antibiotic as prescribed.    Call the wound care center to make the soonest available appointment.    ED Prescriptions    Medication Sig Dispense Auth. Provider   doxycycline (VIBRAMYCIN) 100 MG capsule Take 1 capsule (100 mg total) by mouth 2 (two) times daily. 20 capsule Sharion Balloon, NP     PDMP not reviewed this encounter.   Sharion Balloon, NP 04/27/19 [place], [place], NP 04/27/19 [phone]

## 2019-04-27 NOTE — Telephone Encounter (Signed)
Printing prescriptions for patient at his request.
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
## 2019-04-28 ENCOUNTER — Telehealth (HOSPITAL_COMMUNITY): Payer: Self-pay | Admitting: Emergency Medicine

## 2019-04-28 MED ORDER — SULFAMETHOXAZOLE-TRIMETHOPRIM 800-160 MG PO TABS: 1.0000 | ORAL_TABLET | Freq: Two times a day (BID) | ORAL | 0 refills | Status: AC

## 2019-05-13 ENCOUNTER — Other Ambulatory Visit (HOSPITAL_COMMUNITY)
Admission: RE | Admit: 2019-05-13 | Discharge: 2019-05-13 | Disposition: A | Discharge status: Home / Self Care | Payer: Medicaid Other | Source: Other Acute Inpatient Hospital | Attending: Internal Medicine | Admitting: Internal Medicine

## 2019-05-13 ENCOUNTER — Other Ambulatory Visit: Payer: Self-pay

## 2019-05-13 ENCOUNTER — Encounter (HOSPITAL_BASED_OUTPATIENT_CLINIC_OR_DEPARTMENT_OTHER): Payer: Medicaid Other | Attending: Internal Medicine | Admitting: Internal Medicine

## 2019-05-13 DIAGNOSIS — F17200 Nicotine dependence, unspecified, uncomplicated: Secondary | ICD-10-CM | POA: Insufficient documentation

## 2019-05-13 DIAGNOSIS — I70234 Atherosclerosis of native arteries of right leg with ulceration of heel and midfoot: Secondary | ICD-10-CM | POA: Insufficient documentation

## 2019-05-13 DIAGNOSIS — L97414 Non-pressure chronic ulcer of right heel and midfoot with necrosis of bone: Secondary | ICD-10-CM | POA: Insufficient documentation

## 2019-05-13 DIAGNOSIS — I10 Essential (primary) hypertension: Secondary | ICD-10-CM | POA: Insufficient documentation

## 2019-05-13 DIAGNOSIS — G9009 Other idiopathic peripheral autonomic neuropathy: Secondary | ICD-10-CM | POA: Insufficient documentation

## 2019-05-13 DIAGNOSIS — L942 Calcinosis cutis: Secondary | ICD-10-CM | POA: Insufficient documentation

## 2019-05-13 DIAGNOSIS — Z8614 Personal history of Methicillin resistant Staphylococcus aureus infection: Secondary | ICD-10-CM | POA: Insufficient documentation

## 2019-05-15 LAB — AEROBIC CULTURE W GRAM STAIN (SUPERFICIAL SPECIMEN)

## 2019-05-19 ENCOUNTER — Encounter (HOSPITAL_BASED_OUTPATIENT_CLINIC_OR_DEPARTMENT_OTHER): Payer: Medicaid Other | Admitting: Internal Medicine

## 2019-05-19 DIAGNOSIS — G9009 Other idiopathic peripheral autonomic neuropathy: Secondary | ICD-10-CM | POA: Diagnosis not present

## 2019-05-19 DIAGNOSIS — L97414 Non-pressure chronic ulcer of right heel and midfoot with necrosis of bone: Secondary | ICD-10-CM | POA: Diagnosis not present

## 2019-05-19 DIAGNOSIS — I70234 Atherosclerosis of native arteries of right leg with ulceration of heel and midfoot: Secondary | ICD-10-CM | POA: Diagnosis not present

## 2019-05-19 DIAGNOSIS — Z8614 Personal history of Methicillin resistant Staphylococcus aureus infection: Secondary | ICD-10-CM | POA: Diagnosis not present

## 2019-05-19 DIAGNOSIS — F17200 Nicotine dependence, unspecified, uncomplicated: Secondary | ICD-10-CM | POA: Diagnosis not present

## 2019-05-19 DIAGNOSIS — L942 Calcinosis cutis: Secondary | ICD-10-CM | POA: Diagnosis not present

## 2019-05-19 DIAGNOSIS — I10 Essential (primary) hypertension: Secondary | ICD-10-CM | POA: Diagnosis not present
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
## 2019-05-25 ENCOUNTER — Ambulatory Visit (HOSPITAL_COMMUNITY)
Admission: RE | Admit: 2019-05-25 | Discharge: 2019-05-25 | Disposition: A | Discharge status: Home / Self Care | Payer: Medicaid Other | Source: Ambulatory Visit | Attending: Family | Admitting: Family

## 2019-05-25 ENCOUNTER — Other Ambulatory Visit (HOSPITAL_COMMUNITY)
Admission: RE | Admit: 2019-05-25 | Discharge: 2019-05-25 | Disposition: A | Discharge status: Home / Self Care | Payer: Medicaid Other | Source: Ambulatory Visit | Attending: Vascular Surgery | Admitting: Vascular Surgery

## 2019-05-25 ENCOUNTER — Other Ambulatory Visit: Payer: Self-pay

## 2019-05-25 ENCOUNTER — Encounter: Payer: Self-pay | Admitting: *Deleted

## 2019-05-25 ENCOUNTER — Other Ambulatory Visit: Payer: Self-pay | Admitting: *Deleted

## 2019-05-25 ENCOUNTER — Ambulatory Visit (INDEPENDENT_AMBULATORY_CARE_PROVIDER_SITE_OTHER): Payer: Medicaid Other | Admitting: Vascular Surgery

## 2019-05-25 VITALS — BP 118/64 | HR 73 | Temp 97.8°F | Resp 20 | Ht 73.0 in | Wt 200.0 lb

## 2019-05-25 DIAGNOSIS — F17210 Nicotine dependence, cigarettes, uncomplicated: Secondary | ICD-10-CM | POA: Diagnosis not present

## 2019-05-25 DIAGNOSIS — L97909 Non-pressure chronic ulcer of unspecified part of unspecified lower leg with unspecified severity: Secondary | ICD-10-CM | POA: Diagnosis not present

## 2019-05-25 DIAGNOSIS — I10 Essential (primary) hypertension: Secondary | ICD-10-CM | POA: Insufficient documentation

## 2019-05-25 DIAGNOSIS — Z79899 Other long term (current) drug therapy: Secondary | ICD-10-CM | POA: Insufficient documentation

## 2019-05-25 DIAGNOSIS — I70234 Atherosclerosis of native arteries of right leg with ulceration of heel and midfoot: Secondary | ICD-10-CM | POA: Insufficient documentation

## 2019-05-25 DIAGNOSIS — L97414 Non-pressure chronic ulcer of right heel and midfoot with necrosis of bone: Secondary | ICD-10-CM | POA: Insufficient documentation

## 2019-05-25 DIAGNOSIS — I70219 Atherosclerosis of native arteries of extremities with intermittent claudication, unspecified extremity: Secondary | ICD-10-CM | POA: Diagnosis not present

## 2019-05-25 DIAGNOSIS — Z20828 Contact with and (suspected) exposure to other viral communicable diseases: Secondary | ICD-10-CM | POA: Diagnosis not present

## 2019-05-25 DIAGNOSIS — I70299 Other atherosclerosis of native arteries of extremities, unspecified extremity: Secondary | ICD-10-CM

## 2019-05-25 LAB — SARS CORONAVIRUS 2 (TAT 6-24 HRS): SARS Coronavirus 2: NEGATIVE
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
## 2019-05-26 ENCOUNTER — Encounter (HOSPITAL_BASED_OUTPATIENT_CLINIC_OR_DEPARTMENT_OTHER): Payer: Medicaid Other | Attending: Internal Medicine | Admitting: Internal Medicine

## 2019-05-26 ENCOUNTER — Other Ambulatory Visit (HOSPITAL_BASED_OUTPATIENT_CLINIC_OR_DEPARTMENT_OTHER): Payer: Self-pay | Admitting: Internal Medicine

## 2019-05-26 DIAGNOSIS — F17200 Nicotine dependence, unspecified, uncomplicated: Secondary | ICD-10-CM | POA: Diagnosis not present

## 2019-05-26 DIAGNOSIS — Z6830 Body mass index (BMI) 30.0-30.9, adult: Secondary | ICD-10-CM | POA: Diagnosis not present

## 2019-05-26 DIAGNOSIS — L97414 Non-pressure chronic ulcer of right heel and midfoot with necrosis of bone: Secondary | ICD-10-CM | POA: Diagnosis present

## 2019-05-26 DIAGNOSIS — I10 Essential (primary) hypertension: Secondary | ICD-10-CM | POA: Insufficient documentation

## 2019-05-26 DIAGNOSIS — G9009 Other idiopathic peripheral autonomic neuropathy: Secondary | ICD-10-CM | POA: Diagnosis not present

## 2019-05-26 DIAGNOSIS — L942 Calcinosis cutis: Secondary | ICD-10-CM | POA: Insufficient documentation

## 2019-05-26 DIAGNOSIS — I70234 Atherosclerosis of native arteries of right leg with ulceration of heel and midfoot: Secondary | ICD-10-CM | POA: Insufficient documentation

## 2019-05-26 NOTE — Progress Notes (Addendum)
Mitchell Richard, Mitchell Richard ([id number]) Visit Report for 05/26/2019 Arrival Information Details Patient Name: Date of Service: Mitchell Richard, Mitchell Richard 05/26/2019 8:30 AM Medical Record [id number] Patient Account Number: [id number] Date of Birth/Sex: Treating RN: 1960-09-12 (58 y.o. Ernestene Mention Primary Care Provider: Jack Quarto Other Clinician: Referring Provider: Treating Provider/Extender:[name], Wynn Maudlin, [name] Weeks in Treatment: 1 Visit Information History Since Last Visit All ordered tests and consults were completed: No Patient Arrived: Crutches Added or deleted any medications: No Arrival Time: 08:56 Any new allergies or adverse reactions: No Accompanied By: self Had a fall or experienced change in No Transfer Assistance: None activities of daily living that may affect Patient Identification Verified: Yes risk of falls: Secondary Verification Process Completed: Yes Signs or symptoms of abuse/neglect since last No Patient Requires Transmission-Based No visito Precautions: Hospitalized since last visit: No Patient Has Alerts: No Implantable device outside of the clinic excluding No cellular tissue based products placed in the center since last visit: Has Dressing in Place as Prescribed: Yes Pain Present Now: Yes Notes agram scheduled for tomorrow Electronic Signature(s) Signed: 05/26/2019 6:27:24 PM By: Baruch Gouty RN, BSN Entered By: Baruch Gouty on 05/26/2019 08:57:04 -------------------------------------------------------------------------------- Encounter Discharge Information Details Patient Name: Date of Service: Mitchell Richard, Mitchell Richard 05/26/2019 8:30 AM Medical Record [id number] Patient Account Number: [id number] Date of Birth/Sex: Treating RN: 04/29/1961 (58 y.o. Ernestene Mention Primary Care Provider: Jack Quarto Other Clinician: Referring Provider: Treating Provider/Extender:[name], Wynn Maudlin, [name] Weeks in Treatment: 1 Encounter Discharge  Information Items Post Procedure Vitals Discharge Condition: Stable Temperature (F): 97.7 Ambulatory Status: Crutches Pulse (bpm): 57 Discharge Destination: Home Respiratory Rate (breaths/min): 18 Transportation: Private Auto Blood Pressure (mmHg): 124/69 Accompanied By: self Schedule Follow-up Appointment: Yes Clinical Summary of Care: Patient Declined Electronic Signature(s) Signed: 05/26/2019 6:27:24 PM By: Baruch Gouty RN, BSN Entered By: Baruch Gouty on 05/26/2019 09:44:23 -------------------------------------------------------------------------------- Lower Extremity Assessment Details Patient Name: Date of Service: Mitchell Richard, Mitchell Richard 05/26/2019 8:30 AM Medical Record [id number] Patient Account Number: [id number] Date of Birth/Sex: Treating RN: [birth year] (58 y.o. Ernestene Mention Primary Care Provider: Jack Quarto Other Clinician: Referring Provider: Treating Provider/Extender:[name], Wynn Maudlin, [name] Weeks in Treatment: 1 Edema Assessment Assessed: [Left: No] [Right: No] Edema: [Left: N] [Right: o] Calf Left: Right: Point of Measurement: 44 cm From Medial Instep cm 35 cm Ankle Left: Right: Point of Measurement: 11 cm From Medial Instep cm 22 cm Vascular Assessment Pulses: Dorsalis Pedis Palpable: [Right:No] Electronic Signature(s) Signed: 05/26/2019 6:27:24 PM By: Baruch Gouty RN, BSN Entered By: Baruch Gouty on 05/26/2019 09:07:20 -------------------------------------------------------------------------------- Multi-Disciplinary Care Plan Details Patient Name: Date of Service: Mitchell Richard, Mitchell Richard 05/26/2019 8:30 AM Medical Record [id number] Patient Account Number: [id number] Date of Birth/Sex: Treating RN: Aug 14, 1960 (58 y.o. [name] Primary Care Provider: Jack Quarto Other Clinician: Referring Provider: Treating Provider/Extender:[name], Wynn Maudlin, [name] Weeks in Treatment: 1 Active Inactive Pain, Acute or Chronic Nursing  Diagnoses: Pain, acute or chronic: actual or potential Potential alteration in comfort, pain Goals: Patient will verbalize adequate pain control and receive pain control interventions during procedures as needed Date Initiated: 05/13/2019 Target Resolution Date: 06/17/2019 Goal Status: Active Interventions: Encourage patient to take pain medications as prescribed Provide education on pain management Reposition patient for comfort Treatment Activities: Administer pain control measures as ordered : 05/13/2019 Notes: Wound/Skin Impairment Nursing Diagnoses: Knowledge deficit related to ulceration/compromised skin integrity Goals: Patient/caregiver will verbalize understanding of skin care regimen Date Initiated: 05/13/2019 Target Resolution Date: 06/17/2019 Goal Status: Active Interventions: Assess patient/caregiver ability to perform ulcer/skin care regimen upon admission and as  needed Assess ulceration(s) every visit Provide education on smoking Provide education on ulcer and skin care Treatment Activities: Skin care regimen initiated : 05/13/2019 Topical wound management initiated : 05/13/2019 Notes: Electronic Signature(s) Signed: 05/26/2019 5:16:49 PM By: Cherylin Mylar Signed: 05/26/2019 5:30:28 PM By: Shawn Stall Entered By: Cherylin Mylar on 05/26/2019 09:20:34 -------------------------------------------------------------------------------- Pain Assessment Details Patient Name: Date of Service: Mitchell Richard, Mitchell Richard 05/26/2019 8:30 AM Medical Record [id number] Patient Account Number: [id number] Date of Birth/Sex: Treating RN: [birth year] (58 y.o. [name] Primary Care Provider: Mee Hives Other Clinician: Referring Provider: Treating Provider/Extender:[name], Patrice Paradise, [name] Weeks in Treatment: 1 Active Problems Location of Pain Severity and Description of Pain Patient Has Paino Yes Site Locations Pain Location: Pain in Ulcers With  Dressing Change: Yes Duration of the Pain. Constant / Intermittento Constant Rate the pain. Current Pain Level: 8 Worst Pain Level: 10 Least Pain Level: 8 Character of Pain Describe the Pain: Throbbing Pain Management and Medication Current Pain Management: Medication: Yes Other: lidocaine Is the Current Pain Management Adequate: Adequate How does your wound impact your activities of daily livingo Sleep: Yes Bathing: No Appetite: No Relationship With Others: No Bladder Continence: No Emotions: Yes Bowel Continence: No Work: Yes Toileting: No Drive: Yes Dressing: No Hobbies: No Psychologist, prison and probation services) Signed: 05/26/2019 6:27:24 PM By: Zenaida Deed RN, BSN Entered By: Zenaida Deed on 05/26/2019 09:00:51 -------------------------------------------------------------------------------- Patient/Caregiver Education Details Patient Name: Date of Service: Mitchell Richard 11/5/2020andnbsp8:30 AM Medical Record [phone] Patient Account Number: [id number] Date of Birth/Gender: 1961-02-25 (58 y.o. M) Treating RN: Shawn Stall Primary Care Physician: Mee Hives Other Clinician: Referring Physician: Treating Physician/Extender:[name], Patrice Paradise, [name] Weeks in Treatment: 1 Education Assessment Education Provided To: Patient Education Topics Provided Pain: Handouts: A Guide to Pain Control Methods: Explain/Verbal Responses: Reinforcements needed Wound/Skin Impairment: Methods: Explain/Verbal Responses: State content correctly Electronic Signature(s) Signed: 05/26/2019 5:16:49 PM By: Cherylin Mylar Entered By: Cherylin Mylar on 05/26/2019 09:20:49 -------------------------------------------------------------------------------- Wound Assessment Details Patient Name: Date of Service: Mitchell Richard, Mitchell Richard 05/26/2019 8:30 AM Medical Record [id number] Patient Account Number: [id number] Date of Birth/Sex: Treating RN: 08-14-1960 (58 y.o. [name] Primary Care Provider: Mee Hives Other Clinician: Referring Provider: Treating Provider/Extender:[name], Patrice Paradise, [name] Weeks in Treatment: 1 Wound Status Wound Number: 1 Primary Etiology: Inflammatory Wound Location: Right Achilles Wound Status: Open Wounding Event: Trauma Comorbid History: Hypertension Date Acquired: 02/19/2019 Weeks Of Treatment: 1 Clustered Wound: No Photos Wound Measurements Length: (cm) 2.8 % Reduction Width: (cm) 1.1 % Reduction Depth: (cm) 1.4 Epitheliali Area: (cm) 2.419 Tunneling: Volume: (cm) 3.387 Underminin Wound Description Full Thickness With Exposed Support Foul Odor Classification: Structures Slough/Fi Wound Epibole Margin: Exudate Medium Amount: Exudate Type: Serosanguineous Exudate red, brown Color: Wound Bed Granulation Amount: Small (1-33%) Granulation Quality: Red Fascia Exp Necrotic Amount: Medium (34-66%) Fat Layer Necrotic Quality: Adherent Slough Tendon Exp Muscle Exp Joint Expo Bone Expos After Cleansing: No brino Yes Exposed Structure osed: No (Subcutaneous Tissue) Exposed: Yes osed: Yes osed: No sed: No ed: Yes in Area: 20% in Volume: 55.2% zation: None No g: No Treatment Notes Wound #1 (Right Achilles) 3. Primary Dressing Applied Calcium Alginate Ag 4. Secondary Dressing ABD Pad Roll Gauze 7. Footwear/Offloading device applied Surgical shoe Notes surgical shoe Electronic Signature(s) Signed: 05/27/2019 6:12:06 PM By: Zenaida Deed RN, BSN Signed: 05/30/2019 4:06:15 PM By: Benjaman Kindler EMT/HBOT Previous Signature: 05/26/2019 6:27:24 PM Version By: Zenaida Deed RN, BSN Entered By: Benjaman Kindler on 05/27/2019 08:44:34 -------------------------------------------------------------------------------- Vitals Details Patient Name: Date of Service: Mitchell Richard, Mitchell Richard 05/26/2019 8:30 AM  Medical Record [id number] Patient Account Number: [id number] Date of Birth/Sex: Treating  RN: 1960/07/25 (58 y.o. [name] Primary Care Provider: Mee Hives Other Clinician: Referring Provider: Treating Provider/Extender:[name], Patrice Paradise, [name] Weeks in Treatment: 1 Vital Signs Time Taken: 08:57 Temperature (F): 97.7 Height (in): 73 Pulse (bpm): 57 Source: Stated Respiratory Rate (breaths/min): 18 Weight (lbs): 230 Blood Pressure (mmHg): 124/69 Source: Stated Reference Range: 80 - 120 mg / dl Body Mass Index (BMI): 30.3 Electronic Signature(s) Signed: 05/26/2019 6:27:24 PM By: Zenaida Deed RN, BSN Entered By: Zenaida Deed on 05/26/2019 08:57:45
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
## 2019-05-27 ENCOUNTER — Other Ambulatory Visit: Payer: Self-pay

## 2019-05-27 ENCOUNTER — Encounter (HOSPITAL_COMMUNITY)
Admission: RE | Disposition: A | Discharge status: Home / Self Care | Payer: Self-pay | Source: Home / Self Care | Attending: Vascular Surgery

## 2019-05-27 ENCOUNTER — Ambulatory Visit (HOSPITAL_COMMUNITY)
Admission: RE | Admit: 2019-05-27 | Discharge: 2019-05-27 | Disposition: A | Discharge status: Home / Self Care | Payer: Medicaid Other | Attending: Vascular Surgery | Admitting: Vascular Surgery

## 2019-05-27 DIAGNOSIS — I10 Essential (primary) hypertension: Secondary | ICD-10-CM | POA: Diagnosis not present

## 2019-05-27 DIAGNOSIS — F17210 Nicotine dependence, cigarettes, uncomplicated: Secondary | ICD-10-CM | POA: Diagnosis not present

## 2019-05-27 DIAGNOSIS — Z79899 Other long term (current) drug therapy: Secondary | ICD-10-CM | POA: Diagnosis not present

## 2019-05-27 DIAGNOSIS — L97414 Non-pressure chronic ulcer of right heel and midfoot with necrosis of bone: Secondary | ICD-10-CM | POA: Diagnosis not present

## 2019-05-27 DIAGNOSIS — F4310 Post-traumatic stress disorder, unspecified: Secondary | ICD-10-CM | POA: Diagnosis not present

## 2019-05-27 DIAGNOSIS — I70234 Atherosclerosis of native arteries of right leg with ulceration of heel and midfoot: Secondary | ICD-10-CM | POA: Diagnosis not present

## 2019-05-27 HISTORY — PX: ABDOMINAL AORTOGRAM W/LOWER EXTREMITY: CATH118223

## 2019-05-27 LAB — POCT I-STAT, CHEM 8
BUN: 12 mg/dL (ref 6–20)
Calcium, Ion: 1.24 mmol/L (ref 1.15–1.40)
Chloride: 102 mmol/L (ref 98–111)
Creatinine, Ser: 1.1 mg/dL (ref 0.61–1.24)
Glucose, Bld: 86 mg/dL (ref 70–99)
HCT: 44 % (ref 39.0–52.0)
Hemoglobin: 15 g/dL (ref 13.0–17.0)
Potassium: 4.1 mmol/L (ref 3.5–5.1)
Sodium: 136 mmol/L (ref 135–145)
TCO2: 24 mmol/L (ref 22–32)

## 2019-05-27 SURGERY — ABDOMINAL AORTOGRAM W/LOWER EXTREMITY
Anesthesia: LOCAL

## 2019-05-27 MED ORDER — LIDOCAINE HCL (PF) 1 % IJ SOLN
INTRAMUSCULAR | Status: AC
  Filled 2019-05-27: qty 30

## 2019-05-27 MED ORDER — SODIUM CHLORIDE 0.9 % IV SOLN
INTRAVENOUS | Status: DC
  Administered 2019-05-27: 08:00:00 via INTRAVENOUS

## 2019-05-27 MED ORDER — FENTANYL CITRATE (PF) 100 MCG/2ML IJ SOLN
INTRAMUSCULAR | Status: AC
  Filled 2019-05-27: qty 2

## 2019-05-27 MED ORDER — SODIUM CHLORIDE 0.9 % WEIGHT BASED INFUSION: 1.0000 mL/kg/h | INTRAVENOUS | Status: DC

## 2019-05-27 MED ORDER — LIDOCAINE HCL (PF) 1 % IJ SOLN
INTRAMUSCULAR | Status: DC | PRN
  Administered 2019-05-27: 18 mL via INTRADERMAL

## 2019-05-27 MED ORDER — ATORVASTATIN CALCIUM 10 MG PO TABS: 10.0000 mg | ORAL_TABLET | Freq: Every day | ORAL | 11 refills | Status: DC

## 2019-05-27 MED ORDER — SODIUM CHLORIDE 0.9% FLUSH: 3.0000 mL | INTRAVENOUS | Status: DC | PRN

## 2019-05-27 MED ORDER — SODIUM CHLORIDE 0.9% FLUSH: 3.0000 mL | Freq: Two times a day (BID) | INTRAVENOUS | Status: DC

## 2019-05-27 MED ORDER — ONDANSETRON HCL 4 MG/2ML IJ SOLN: 4.0000 mg | Freq: Four times a day (QID) | INTRAMUSCULAR | Status: DC | PRN

## 2019-05-27 MED ORDER — HEPARIN (PORCINE) IN NACL 1000-0.9 UT/500ML-% IV SOLN
INTRAVENOUS | Status: DC | PRN
  Administered 2019-05-27 (×2): 500 mL

## 2019-05-27 MED ORDER — SODIUM CHLORIDE 0.9 % IV SOLN: 250.0000 mL | INTRAVENOUS | Status: DC | PRN

## 2019-05-27 MED ORDER — HEPARIN (PORCINE) IN NACL 1000-0.9 UT/500ML-% IV SOLN
INTRAVENOUS | Status: AC
  Filled 2019-05-27: qty 1000

## 2019-05-27 MED ORDER — ATORVASTATIN CALCIUM 10 MG PO TABS: 10.0000 mg | ORAL_TABLET | Freq: Every day | ORAL | Status: DC

## 2019-05-27 MED ORDER — MIDAZOLAM HCL 2 MG/2ML IJ SOLN
INTRAMUSCULAR | Status: DC | PRN
  Administered 2019-05-27: 1 mg via INTRAVENOUS

## 2019-05-27 MED ORDER — FENTANYL CITRATE (PF) 100 MCG/2ML IJ SOLN
INTRAMUSCULAR | Status: DC | PRN
  Administered 2019-05-27: 50 ug via INTRAVENOUS

## 2019-05-27 MED ORDER — OXYCODONE HCL 5 MG PO TABS: 5.0000 mg | ORAL_TABLET | ORAL | Status: DC | PRN

## 2019-05-27 MED ORDER — ACETAMINOPHEN 325 MG PO TABS: 650.0000 mg | ORAL_TABLET | ORAL | Status: DC | PRN

## 2019-05-27 MED ORDER — MIDAZOLAM HCL 2 MG/2ML IJ SOLN
INTRAMUSCULAR | Status: AC
  Filled 2019-05-27: qty 2

## 2019-05-27 MED ORDER — HYDRALAZINE HCL 20 MG/ML IJ SOLN: 5.0000 mg | INTRAMUSCULAR | Status: DC | PRN

## 2019-05-27 MED ORDER — LABETALOL HCL 5 MG/ML IV SOLN: 10.0000 mg | INTRAVENOUS | Status: DC | PRN

## 2019-05-27 MED ORDER — IODIXANOL 320 MG/ML IV SOLN
INTRAVENOUS | Status: DC | PRN
  Administered 2019-05-27: 105 mL via INTRA_ARTERIAL

## 2019-05-27 SURGICAL SUPPLY — 11 items
CATH ANGIO 5F PIGTAIL 65CM (CATHETERS) ×2 IMPLANT
CATH CROSS OVER TEMPO 5F (CATHETERS) ×2 IMPLANT
CATH STRAIGHT 5FR 65CM (CATHETERS) ×2 IMPLANT
KIT MICROPUNCTURE NIT STIFF (SHEATH) ×2 IMPLANT
KIT PV (KITS) ×2 IMPLANT
SHEATH PINNACLE 5F 10CM (SHEATH) ×2 IMPLANT
SHEATH PROBE COVER 6X72 (BAG) ×2 IMPLANT
SYR MEDRAD MARK V 150ML (SYRINGE) ×2 IMPLANT
TRANSDUCER W/STOPCOCK (MISCELLANEOUS) ×2 IMPLANT
TRAY PV CATH (CUSTOM PROCEDURE TRAY) ×2 IMPLANT
WIRE BENTSON .035X145CM (WIRE) ×2 IMPLANT

## 2019-05-27 NOTE — Interval H&P Note (Signed)
History and Physical Interval Note:  05/27/2019 9:34 AM  Mitchell Richard  has presented today for surgery, with the diagnosis of pvd ulcer.  The various methods of treatment have been discussed with the patient and family. After consideration of risks, benefits and other options for treatment, the patient has consented to  Procedure(s): ABDOMINAL AORTOGRAM W/LOWER EXTREMITY (N/A) as a surgical intervention.  The patient's history has been reviewed, patient examined, no change in status, stable for surgery.  I have reviewed the patient's chart and labs.  Questions were answered to the patient's satisfaction.     Deitra Mayo

## 2019-05-27 NOTE — Progress Notes (Signed)
Site area: leftgroin  Site Prior to Removal:  Level 0  Pressure Applied For 20 MINUTES    Minutes Beginning at 1045   Manual:   Yes.    Patient Status During Pull:  Stable  Post Pull Groin Site:  Level 0  Post Pull Instructions Given:  Yes.    Post Pull Pulses Present:  Yes.    Dressing Applied:  Yes.    Comments:  Bed rest started at 1105

## 2019-05-27 NOTE — Op Note (Signed)
PATIENT: Mitchell Richard      MRN: [id number] DOB: [birth year]    DATE OF PROCEDURE: 05/27/2019  INDICATIONS:    Mitchell Richard is a 58 y.o. male who presented with a nonhealing wound on his right Achilles.  He had evidence of infrainguinal arterial occlusive disease.  He presents for arteriography and possible intervention.  PROCEDURE:    1.  Ultrasound-guided access to the left common femoral artery 2.  Conscious sedation 3.  Aortogram with bilateral iliac arteriogram 4.  Selective catheterization of the right external iliac artery (second order catheterization) with right lower extremity runoff 5.  Retrograde left femoral arteriogram  SURGEON: Di Kindle. Edilia Bo, MD, FACS  ANESTHESIA: Local with sedation  EBL: Minimal  TECHNIQUE: The patient was brought to the peripheral vascular lab and was sedated. The period of conscious sedation was 34 minutes.  During that time period, I was present face-to-face 100% of the time.  The patient was administered 1 mg of Versed and 50 mcg of fentanyl. The patient's heart rate, blood pressure, and oxygen saturation were monitored by the nurse continuously during the procedure.  Both groins were prepped and draped in the usual sterile fashion.  Under ultrasound guidance, after the skin was anesthetized, I cannulated the left common femoral artery with a micropuncture needle and a micropuncture sheath was introduced over a wire.  This was exchanged for a 5 [place] sheath over a Bentson wire.  By ultrasound the femoral artery was patent.  A real-time image was obtained and sent to the server.  The pigtail catheter was positioned at the L1 vertebral body and flush aortogram obtained.  Cath was positioned above the aortic bifurcation and an oblique iliac projection was obtained.  I then positioned a crossover catheter in the proximal right common iliac artery and the wire was advanced into the external iliac artery.  I exchanged the crossover catheter for a  straight catheter.  Selective right external iliac arteriogram was obtained with right lower extremity runoff.  This catheter was then removed.  A retrograde left femoral arteriogram was then obtained.  Left lower extremity runoff was obtained.  Patient was then transferred to the holding area for removal of the sheath.  No immediate complications were noted.  FINDINGS:   1.  Both renal arteries are patent.  The infrarenal aorta is patent.  The right common iliac artery is slightly ectatic.  There is a mild 20% stenosis in the left common iliac artery.  The external iliac arteries are patent. 2.  On the right side, which is the side of concern, the common femoral and deep femoral artery are patent.  There is minimal stenosis of the proximal deep femoral artery.  There is a flush occlusion of the right superficial femoral artery.  There is reconstitution of the above-knee popliteal artery with three-vessel runoff on the right.  There is poor runoff on the foot however with an occluded posterior tibial and dorsalis pedis artery in the foot. 3.  On the left side, the common femoral artery and deep femoral artery are patent.  There is a flush occlusion of the superficial femoral artery at its origin.  There is reconstitution of the popliteal artery at the level of the knee.  There is three-vessel runoff on the left.  CLINICAL NOTE: The patient will need preoperative cardiac clearance prior to a right femoropopliteal bypass.  He will need a right femoral to above-knee popliteal artery bypass.  I have also get him into the office for  a vein map and discussion of his surgery as he seems to have a poor understanding of what needs to be done.  Deitra Mayo, MD, FACS [facility]  DATE OF DICTATION:   05/27/2019

## 2019-05-27 NOTE — Discharge Instructions (Signed)
Angiogram, Care After °This sheet gives you information about how to care for yourself after your procedure. Your health care provider may also give you more specific instructions. If you have problems or questions, contact your health care provider. °What can I expect after the procedure? °After the procedure, it is common to have bruising and tenderness at the catheter insertion area. °Follow these instructions at home: °Insertion site care °· Follow instructions from your health care provider about how to take care of your insertion site. Make sure you: °? Wash your hands with soap and water before you change your bandage (dressing). If soap and water are not available, use hand sanitizer. °? Change your dressing as told by your health care provider. °? Leave stitches (sutures), skin glue, or adhesive strips in place. These skin closures may need to stay in place for 2 weeks or longer. If adhesive strip edges start to loosen and curl up, you may trim the loose edges. Do not remove adhesive strips completely unless your health care provider tells you to do that. °· Do not take baths, swim, or use a hot tub until your health care provider approves. °· You may shower 24-48 hours after the procedure or as told by your health care provider. °? Gently wash the site with plain soap and water. °? Pat the area dry with a clean towel. °? Do not rub the site. This may cause bleeding. °· Do not apply powder or lotion to the site. Keep the site clean and dry. °· Check your insertion site every day for signs of infection. Check for: °? Redness, swelling, or pain. °? Fluid or blood. °? Warmth. °? Pus or a bad smell. °Activity °· Rest as told by your health care provider, usually for 1-2 days. °· Do not lift anything that is heavier than 10 lbs. (4.5 kg) or as told by your health care provider. °· Do not drive for 24 hours if you were given a medicine to help you relax (sedative). °· Do not drive or use heavy machinery while  taking prescription pain medicine. °General instructions ° °· Return to your normal activities as told by your health care provider, usually in about a week. Ask your health care provider what activities are safe for you. °· If the catheter site starts bleeding, lie flat and put pressure on the site. If the bleeding does not stop, get help right away. This is a medical emergency. °· Drink enough fluid to keep your urine clear or pale yellow. This helps flush the contrast dye from your body. °· Take over-the-counter and prescription medicines only as told by your health care provider. °· Keep all follow-up visits as told by your health care provider. This is important. °Contact a health care provider if: °· You have a fever or chills. °· You have redness, swelling, or pain around your insertion site. °· You have fluid or blood coming from your insertion site. °· The insertion site feels warm to the touch. °· You have pus or a bad smell coming from your insertion site. °· You have bruising around the insertion site. °· You notice blood collecting in the tissue around the catheter site (hematoma). The hematoma may be painful to the touch. °Get help right away if: °· You have severe pain at the catheter insertion area. °· The catheter insertion area swells very fast. °· The catheter insertion area is bleeding, and the bleeding does not stop when you hold steady pressure on the area. °·   The area near or just beyond the catheter insertion site becomes pale, cool, tingly, or numb. These symptoms may represent a serious problem that is an emergency. Do not wait to see if the symptoms will go away. Get medical help right away. Call your local emergency services (911 in the [place] Do not drive yourself to the hospital. Summary  After the procedure, it is common to have bruising and tenderness at the catheter insertion area.  After the procedure, it is important to rest and drink plenty of fluids.  Do not take baths,  swim, or use a hot tub until your health care provider says it is okay to do so. You may shower 24-48 hours after the procedure or as told by your health care provider.  If the catheter site starts bleeding, lie flat and put pressure on the site. If the bleeding does not stop, get help right away. This is a medical emergency. This information is not intended to replace advice given to you by your health care provider. Make sure you discuss any questions you have with your health care provider. Document Released: 01/23/2005 Document Revised: 06/19/2017 Document Reviewed: 06/11/2016 Elsevier Patient Education  2020 Elsevier Inc. Atorvastatin tablets What is this medicine? ATORVASTATIN (a TORE va sta tin) is known as a HMG-CoA reductase inhibitor or 'statin'. It lowers the level of cholesterol and triglycerides in the blood. This drug may also reduce the risk of heart attack, stroke, or other health problems in patients with risk factors for heart disease. Diet and lifestyle changes are often used with this drug. This medicine may be used for other purposes; ask your health care provider or pharmacist if you have questions. COMMON BRAND NAME(S): Lipitor What should I tell my health care provider before I take this medicine? They need to know if you have any of these conditions:  diabetes  if you often drink alcohol  history of stroke  kidney disease  liver disease  muscle aches or weakness  thyroid disease  an unusual or allergic reaction to atorvastatin, other medicines, foods, dyes, or preservatives  pregnant or trying to get pregnant  breast-feeding How should I use this medicine? Take this medicine by mouth with a glass of water. Follow the directions on the prescription label. You can take it with or without food. If it upsets your stomach, take it with food. Do not take with grapefruit juice. Take your medicine at regular intervals. Do not take it more often than directed. Do  not stop taking except on your doctor's advice. Talk to your pediatrician regarding the use of this medicine in children. While this drug may be prescribed for children as young as 10 for selected conditions, precautions do apply. Overdosage: If you think you have taken too much of this medicine contact a poison control center or emergency room at once. NOTE: This medicine is only for you. Do not share this medicine with others. What if I miss a dose? If you miss a dose, take it as soon as you can. If your next dose is to be taken in less than 12 hours, then do not take the missed dose. Take the next dose at your regular time. Do not take double or extra doses. What may interact with this medicine? Do not take this medicine with any of the following medications:  dasabuvir; ombitasvir; paritaprevir; ritonavir  ombitasvir; paritaprevir; ritonavir  posaconazole  red yeast rice This medicine may also interact with the following medications:  alcohol  birth control pills  certain antibiotics like erythromycin and clarithromycin  certain antivirals for HIV or hepatitis  certain medicines for cholesterol like fenofibrate, gemfibrozil, and niacin  certain medicines for fungal infections like ketoconazole and itraconazole  colchicine  cyclosporine  digoxin  grapefruit juice  rifampin This list may not describe all possible interactions. Give your health care provider a list of all the medicines, herbs, non-prescription drugs, or dietary supplements you use. Also tell them if you smoke, drink alcohol, or use illegal drugs. Some items may interact with your medicine. What should I watch for while using this medicine? Visit your doctor or health care professional for regular check-ups. You may need regular tests to make sure your liver is working properly. Your health care professional may tell you to stop taking this medicine if you develop muscle problems. If your muscle problems do  not go away after stopping this medicine, contact your health care professional. Do not become pregnant while taking this medicine. Women should inform their health care professional if they wish to become pregnant or think they might be pregnant. There is a potential for serious side effects to an unborn child. Talk to your health care professional or pharmacist for more information. Do not breast-feed an infant while taking this medicine. This medicine may increase blood sugar. Ask your healthcare provider if changes in diet or medicines are needed if you have diabetes. If you are going to need surgery or other procedure, tell your doctor that you are using this medicine. This drug is only part of a total heart-health program. Your doctor or a dietician can suggest a low-cholesterol and low-fat diet to help. Avoid alcohol and smoking, and keep a proper exercise schedule. This medicine may cause a decrease in Co-Enzyme Q-10. You should make sure that you get enough Co-Enzyme Q-10 while you are taking this medicine. Discuss the foods you eat and the vitamins you take with your health care professional. What side effects may I notice from receiving this medicine? Side effects that you should report to your doctor or health care professional as soon as possible:  allergic reactions like skin rash, itching or hives, swelling of the face, lips, or tongue  fever  joint pain  loss of memory  redness, blistering, peeling or loosening of the skin, including inside the mouth  signs and symptoms of high blood sugar such as being more thirsty or hungry or having to urinate more than normal. You may also feel very tired or have blurry vision.  signs and symptoms of liver injury like dark yellow or brown urine; general ill feeling or flu-like symptoms; light-belly pain; unusually weak or tired; yellowing of the eyes or skin  signs and symptoms of muscle injury like dark urine; trouble passing urine or  change in the amount of urine; unusually weak or tired; muscle pain or side or back pain Side effects that usually do not require medical attention (report to your doctor or health care professional if they continue or are bothersome):  diarrhea  nausea  stomach pain  trouble sleeping  upset stomach This list may not describe all possible side effects. Call your doctor for medical advice about side effects. You may report side effects to FDA at 1-800-FDA-1088. Where should I keep my medicine? Keep out of the reach of children. Store between 20 and 25 degrees C (68 and 77 degrees F). Throw away any unused medicine after the expiration date. NOTE: This sheet is a summary. It may  not cover all possible information. If you have questions about this medicine, talk to your doctor, pharmacist, or health care provider.  2020 Elsevier/Gold Standard (2018-04-28 11:36:16)

## 2019-05-27 NOTE — Progress Notes (Signed)
Up and walked and tolerated well; left groin stable no bleeding or hematoma 
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
## 2019-05-30 ENCOUNTER — Encounter (HOSPITAL_COMMUNITY): Payer: Self-pay | Admitting: Vascular Surgery

## 2019-05-30 ENCOUNTER — Other Ambulatory Visit: Payer: Self-pay | Admitting: *Deleted

## 2019-05-30 ENCOUNTER — Encounter: Payer: Self-pay | Admitting: Cardiovascular Disease

## 2019-05-30 ENCOUNTER — Other Ambulatory Visit: Payer: Self-pay

## 2019-05-30 ENCOUNTER — Ambulatory Visit (INDEPENDENT_AMBULATORY_CARE_PROVIDER_SITE_OTHER): Payer: Medicaid Other | Admitting: Cardiovascular Disease

## 2019-05-30 VITALS — BP 105/73 | HR 69 | Temp 96.4°F | Ht 73.0 in | Wt 190.0 lb

## 2019-05-30 DIAGNOSIS — Z720 Tobacco use: Secondary | ICD-10-CM | POA: Diagnosis not present

## 2019-05-30 DIAGNOSIS — I70219 Atherosclerosis of native arteries of extremities with intermittent claudication, unspecified extremity: Secondary | ICD-10-CM | POA: Diagnosis not present

## 2019-05-30 DIAGNOSIS — Z01810 Encounter for preprocedural cardiovascular examination: Secondary | ICD-10-CM

## 2019-05-30 DIAGNOSIS — R072 Precordial pain: Secondary | ICD-10-CM

## 2019-05-30 DIAGNOSIS — R079 Chest pain, unspecified: Secondary | ICD-10-CM | POA: Diagnosis not present

## 2019-05-30 DIAGNOSIS — I10 Essential (primary) hypertension: Secondary | ICD-10-CM

## 2019-05-30 NOTE — Patient Instructions (Signed)
Medication Instructions:  Start taking baby aspirin daily.   If you need a refill on your cardiac medications before your next appointment, please call your pharmacy.   Lab work: NONE  Testing/Procedures: Your physician has requested that you have a lexiscan myoview. For further information please visit [email or web address]. Please follow instruction sheet, as given.  Your physician has requested that you have an echocardiogram. Echocardiography is a painless test that uses sound waves to create images of your heart. It provides your doctor with information about the size and shape of your heart and how well your heart's chambers and valves are working. This procedure takes approximately one hour. There are no restrictions for this procedure. [address]   Follow-Up: At [facility], you and your health needs are our priority.  As part of our continuing mission to provide you with exceptional heart care, we have created designated Provider Care Teams.  These Care Teams include your primary Cardiologist (physician) and Advanced Practice Providers (APPs -  Physician Assistants and Nurse Practitioners) who all work together to provide you with the care you need, when you need it. You may see Dr Audie Box or one of the following Advanced Practice Providers on your designated Care Team:    Almyra Deforest, PA-C  Fabian Sharp, [place] or   Roby Lofts, [place]  Your physician wants you to follow-up in: 3 months. You will receive a reminder letter in the mail two months in advance. If you don't receive a letter, please call our office to schedule the follow-up appointment.

## 2019-05-31 ENCOUNTER — Telehealth (HOSPITAL_COMMUNITY): Payer: Self-pay

## 2019-05-31 NOTE — Telephone Encounter (Signed)
Encounter complete. 

## 2019-06-01 ENCOUNTER — Other Ambulatory Visit: Payer: Self-pay

## 2019-06-01 ENCOUNTER — Ambulatory Visit (HOSPITAL_COMMUNITY)
Admission: RE | Admit: 2019-06-01 | Discharge: 2019-06-01 | Disposition: A | Discharge status: Home / Self Care | Payer: Medicaid Other | Source: Ambulatory Visit | Attending: Cardiovascular Disease | Admitting: Cardiovascular Disease

## 2019-06-01 DIAGNOSIS — R079 Chest pain, unspecified: Secondary | ICD-10-CM | POA: Diagnosis not present

## 2019-06-01 DIAGNOSIS — Z01810 Encounter for preprocedural cardiovascular examination: Secondary | ICD-10-CM | POA: Diagnosis present

## 2019-06-01 MED ORDER — REGADENOSON 0.4 MG/5ML IV SOLN
0.4000 mg | Freq: Once | INTRAVENOUS | Status: AC
  Administered 2019-06-01: 0.4 mg via INTRAVENOUS

## 2019-06-01 MED ORDER — TECHNETIUM TC 99M TETROFOSMIN IV KIT
31.0000 | PACK | Freq: Once | INTRAVENOUS | Status: AC | PRN
  Administered 2019-06-01: 31 via INTRAVENOUS
  Filled 2019-06-01: qty 31

## 2019-06-01 MED ORDER — TECHNETIUM TC 99M TETROFOSMIN IV KIT
9.4000 | PACK | Freq: Once | INTRAVENOUS | Status: AC | PRN
  Administered 2019-06-01: 9.4 via INTRAVENOUS
  Filled 2019-06-01: qty 10

## 2019-06-02 ENCOUNTER — Other Ambulatory Visit: Payer: Self-pay | Admitting: Vascular Surgery

## 2019-06-02 ENCOUNTER — Encounter (HOSPITAL_BASED_OUTPATIENT_CLINIC_OR_DEPARTMENT_OTHER): Payer: Medicaid Other | Admitting: Internal Medicine

## 2019-06-02 DIAGNOSIS — L97909 Non-pressure chronic ulcer of unspecified part of unspecified lower leg with unspecified severity: Secondary | ICD-10-CM

## 2019-06-02 DIAGNOSIS — I70299 Other atherosclerosis of native arteries of extremities, unspecified extremity: Secondary | ICD-10-CM

## 2019-06-02 DIAGNOSIS — I70234 Atherosclerosis of native arteries of right leg with ulceration of heel and midfoot: Secondary | ICD-10-CM | POA: Diagnosis not present

## 2019-06-02 LAB — MYOCARDIAL PERFUSION IMAGING
LV dias vol: 140 mL (ref 62–150)
LV sys vol: 91 mL
Peak HR: 76 {beats}/min
Rest HR: 51 {beats}/min
SDS: 2
SRS: 7
SSS: 9
TID: 1.17
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
## 2019-06-03 ENCOUNTER — Telehealth: Payer: Self-pay | Admitting: Cardiovascular Disease

## 2019-06-03 NOTE — Telephone Encounter (Signed)
Returned call to patient advised treadmill results not available.We will call back after Dr.O'Neil reviews.

## 2019-06-03 NOTE — Telephone Encounter (Signed)
See previous 11/13 telephone note.
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
## 2019-06-03 NOTE — Telephone Encounter (Signed)
Called and left message on number provided that we need to discuss recent stress results. No answer.   [place] T. Audie Box, [place]  [address], [place], [place] [address] [phone]  9:01 AM

## 2019-06-03 NOTE — Telephone Encounter (Signed)
Patient's sister, Kern Reap, returning Cheryl's call in regards to results. I did not see an DPR on file and she states that Keane is sleeping.

## 2019-06-03 NOTE — Telephone Encounter (Addendum)
Follow up  Unable to reach MD/nurse  Please return call to patient with results stress test

## 2019-06-03 NOTE — Telephone Encounter (Signed)
Spoke to patient appointment scheduled with Dr.O'Neal 06/08/19 at 3:40 pm.

## 2019-06-03 NOTE — Telephone Encounter (Signed)
Will forward to Dr Audie Box

## 2019-06-03 NOTE — Telephone Encounter (Signed)
Returned call to patient no answer.No voice mail. 

## 2019-06-03 NOTE — Telephone Encounter (Signed)
Called Mr. Mitchell Richard and discussed recent stress test with concerns for prior infarction. He reports no CP. He will get echo on 11/17. Will have him come see me on 11/18. Will likely have heart cath to define anatomy. Will discuss at that visit.   [place] T. Audie Box, [place]  [address], [place], [place] [address] [phone]  3:04 PM

## 2019-06-03 NOTE — Telephone Encounter (Signed)
See previous 11/13 telephone note. 

## 2019-06-07 ENCOUNTER — Ambulatory Visit (HOSPITAL_COMMUNITY): Payer: Medicaid Other | Attending: Cardiology

## 2019-06-07 ENCOUNTER — Other Ambulatory Visit: Payer: Self-pay

## 2019-06-07 DIAGNOSIS — Z01810 Encounter for preprocedural cardiovascular examination: Secondary | ICD-10-CM

## 2019-06-07 DIAGNOSIS — R079 Chest pain, unspecified: Secondary | ICD-10-CM

## 2019-06-07 MED ORDER — PERFLUTREN LIPID MICROSPHERE
1.0000 mL | INTRAVENOUS | Status: AC | PRN
  Administered 2019-06-07: 2 mL via INTRAVENOUS
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
## 2019-06-08 ENCOUNTER — Telehealth: Payer: Self-pay | Admitting: Cardiovascular Disease

## 2019-06-08 ENCOUNTER — Ambulatory Visit: Payer: Medicaid Other | Admitting: Cardiovascular Disease

## 2019-06-08 NOTE — Telephone Encounter (Signed)
Attempted to call Mr. Filsinger about recent echo results. I am concerned about LV noncompaction. Will need cardiac MRI.   [place] T. Audie Box, [place]  [address] [place], [place] [address] [phone]  4:08 PM

## 2019-06-09 ENCOUNTER — Telehealth: Payer: Self-pay | Admitting: Cardiovascular Disease

## 2019-06-09 ENCOUNTER — Encounter (HOSPITAL_BASED_OUTPATIENT_CLINIC_OR_DEPARTMENT_OTHER): Payer: Medicaid Other | Admitting: Internal Medicine

## 2019-06-09 ENCOUNTER — Other Ambulatory Visit: Payer: Self-pay

## 2019-06-09 DIAGNOSIS — I428 Other cardiomyopathies: Secondary | ICD-10-CM

## 2019-06-09 DIAGNOSIS — I70234 Atherosclerosis of native arteries of right leg with ulceration of heel and midfoot: Secondary | ICD-10-CM | POA: Diagnosis not present

## 2019-06-09 NOTE — Telephone Encounter (Signed)
Called and discussed recent echo and stress results with Mitchell Richard. To me, I am concerned for LV noncompaction due to the amount of noncompacted myocardium present in the LV/RV. His EF appears to be 45-50% on my review. We will proceed with CMR for better characterization. This will have implications for anticoagulation. We will hold on vascular surgery until MRI. He was in agreement with this plan.   [place] T. Audie Box, [place]  [address] [place], [place] [address] [phone]  9:12 AM
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
## 2019-06-13 NOTE — Progress Notes (Addendum)
KORBY, RATAY ([id number]) Visit Report for 06/09/2019 Arrival Information Details Patient Name: Date of Service: Mitchell Richard, Mitchell Richard 06/09/2019 11:00 AM Medical Record [id number] Patient Account Number: [id number] Date of Birth/Sex: Treating RN: 1961-06-21 (58 y.o. Janyth Contes Primary Care Provider: Jack Quarto Other Clinician: Referring Provider: Treating Provider/Extender:[name], Wynn Maudlin, [name] Weeks in Treatment: 3 Visit Information History Since Last Visit Cane Added or deleted any medications: No Patient Arrived: 11:49 Any new allergies or adverse reactions: No Arrival Time: alone Had a fall or experienced change in No Accompanied By: None activities of daily living that may affect Transfer Assistance: risk of falls: Patient Identification Verified: Yes Signs or symptoms of abuse/neglect since last No Secondary Verification Process Completed: Yes visito Patient Requires Transmission-Based No Hospitalized since last visit: No Precautions: Implantable device outside of the clinic excluding No Patient Has Alerts: No cellular tissue based products placed in the center since last visit: Has Dressing in Place as Prescribed: Yes Pain Present Now: Yes Electronic Signature(s) Signed: 06/13/2019 2:18:43 PM By: Levan Hurst RN, BSN Entered By: Levan Hurst on 06/09/2019 11:50:37 -------------------------------------------------------------------------------- Clinic Level of Care Assessment Details Patient Name: Date of Service: MOKSH, LOOMER 06/09/2019 11:00 AM Medical Record [id number] Patient Account Number: [id number] Date of Birth/Sex: Treating RN: Jun 29, 1961 (58 y.o. [name] Primary Care Provider: Jack Quarto Other Clinician: Referring Provider: Treating Provider/Extender:[name], Wynn Maudlin, [name] Weeks in Treatment: 3 Clinic Level of Care Assessment Items TOOL 4 Quantity Score X - Use when only an EandM is performed on FOLLOW-UP  visit 1 0 ASSESSMENTS - Nursing Assessment / Reassessment X - Reassessment of Co-morbidities (includes updates in patient status) 1 10 X - Reassessment of Adherence to Treatment Plan 1 5 ASSESSMENTS - Wound and Skin Assessment / Reassessment X - Simple Wound Assessment / Reassessment - one wound 1 5 X - Complex Wound Assessment / Reassessment - multiple wounds 1 5 []  - Dermatologic / Skin Assessment (not related to wound area) 0 ASSESSMENTS - Focused Assessment X - Circumferential Edema Measurements - multi extremities 1 5 X - Nutritional Assessment / Counseling / Intervention 1 10 []  - Lower Extremity Assessment (monofilament, tuning fork, pulses) 0 []  - Peripheral Arterial Disease Assessment (using hand held doppler) 0 ASSESSMENTS - Ostomy and/or Continence Assessment and Care []  - Incontinence Assessment and Management 0 []  - Ostomy Care Assessment and Management (repouching, etc.) 0 PROCESS - Coordination of Care []  - Simple Patient / Family Education for ongoing care 0 X - Complex (extensive) Patient / Family Education for ongoing care 1 20 X - Staff obtains Programmer, systems, Records, Test Results / Process Orders 1 10 []  - Staff telephones HHA, Nursing Homes / Clarify orders / etc 0 []  - Routine Transfer to another Facility (non-emergent condition) 0 []  - Routine Hospital Admission (non-emergent condition) 0 []  - New Admissions / Biomedical engineer / Ordering NPWT, Apligraf, etc. 0 []  - Emergency Hospital Admission (emergent condition) 0 []  - Simple Discharge Coordination 0 X - Complex (extensive) Discharge Coordination 1 15 PROCESS - Special Needs []  - Pediatric / Minor Patient Management 0 []  - Isolation Patient Management 0 []  - Hearing / Language / Visual special needs 0 []  - Assessment of Community assistance (transportation, D/C planning, etc.) 0 []  - Additional assistance / Altered mentation 0 []  - Support Surface(s) Assessment (bed, cushion, seat, etc.) 0 INTERVENTIONS -  Wound Cleansing / Measurement []  - Simple Wound Cleansing - one wound 0 X - Complex Wound Cleansing - multiple wounds 1 5 X - Wound Imaging (  photographs - any number of wounds) 1 5 []  - Wound Tracing (instead of photographs) 0 []  - Simple Wound Measurement - one wound 0 X - Complex Wound Measurement - multiple wounds 1 5 INTERVENTIONS - Wound Dressings X - Small Wound Dressing one or multiple wounds 1 10 []  - Medium Wound Dressing one or multiple wounds 0 []  - Large Wound Dressing one or multiple wounds 0 []  - Application of Medications - topical 0 []  - Application of Medications - injection 0 INTERVENTIONS - Miscellaneous []  - External ear exam 0 []  - Specimen Collection (cultures, biopsies, blood, body fluids, etc.) 0 []  - Specimen(s) / Culture(s) sent or taken to Lab for analysis 0 []  - Patient Transfer (multiple staff / Nurse, adultHoyer Lift / Similar devices) 0 []  - Simple Staple / Suture removal (25 or less) 0 []  - Complex Staple / Suture removal (26 or more) 0 []  - Hypo / Hyperglycemic Management (close monitor of Blood Glucose) 0 []  - Ankle / Brachial Index (ABI) - do not check if billed separately 0 X - Vital Signs 1 5 Has the patient been seen at the hospital within the last three years: Yes Total Score: 115 Level Of Care: New/Established - Level 3 Electronic Signature(s) Signed: 06/09/2019 6:38:14 PM By: [name], [name] Entered By: [name], [name] on 06/09/2019 18:32:30 -------------------------------------------------------------------------------- Encounter Discharge Information Details Patient Name: Date of Service: Eldred MangesCOWAN, Daemyn 06/09/2019 11:00 AM Medical Record [id number] Patient Account Number: [id number] Date of Birth/Sex: Treating RN: 07-Apr-1961 (58 y.o. Damaris SchoonerM) Boehlein, Linda Primary Care Provider: Mee HivesPLACEY, [name] Other Clinician: Referring Provider: Treating Provider/Extender:[name], [name] [name], [name] Weeks in Treatment: 3 Encounter Discharge Information  Items Discharge Condition: Stable Ambulatory Status: Cane Discharge Destination: Home Transportation: Private Auto Accompanied By: self Schedule Follow-up Appointment: Yes Clinical Summary of Care: Patient Declined Electronic Signature(s) Signed: 06/09/2019 6:27:04 PM By: Zenaida DeedBoehlein, Linda RN, BSN Entered By: Zenaida DeedBoehlein, Linda on 06/09/2019 12:30:50 -------------------------------------------------------------------------------- Lower Extremity Assessment Details Patient Name: Date of Service: Eldred MangesCOWAN, Lennie 06/09/2019 11:00 AM Medical Record [id number] Patient Account Number: [id number] Date of Birth/Sex: Treating RN: 07-Apr-1961 (58 y.o. Elizebeth KollerM) Lynch, Shatara Primary Care Provider: Mee HivesPLACEY, [name] Other Clinician: Referring Provider: Treating Provider/Extender:[name], [name] [name], [name] Weeks in Treatment: 3 Edema Assessment Assessed: [Left: No] [Right: No] Edema: [Left: N] [Right: o] Calf Left: Right: Point of Measurement: 44 cm From Medial Instep cm 35.5 cm Ankle Left: Right: Point of Measurement: 11 cm From Medial Instep cm 23 cm Vascular Assessment Pulses: Dorsalis Pedis Palpable: [Right:Yes] Electronic Signature(s) Signed: 06/13/2019 2:18:43 PM By: Zandra AbtsLynch, Shatara RN, BSN Entered By: Zandra AbtsLynch, Shatara on 06/09/2019 11:56:48 -------------------------------------------------------------------------------- Multi Wound Chart Details Patient Name: Date of Service: Eldred MangesCOWAN, Jahquan 06/09/2019 11:00 AM Medical Record [id number] Patient Account Number: [id number] Date of Birth/Sex: Treating RN: 07-Apr-1961 (58 y.o. [name]) [name], [name] Primary Care Provider: Mee HivesPLACEY, [name] Other Clinician: Referring Provider: Treating Provider/Extender:[name], [name] [name], [name] Weeks in Treatment: 3 Vital Signs Height(in): 73 Pulse(bpm): 48 Weight(lbs): 230 Blood Pressure(mmHg): 131/82 Body Mass Index(BMI): 30 Temperature(F): 98.0 Respiratory 18 Rate(breaths/min): Photos: [1:No Photos]  [N/A:N/A] Wound Location: [1:Right Achilles] [N/A:N/A] Wounding Event: [1:Trauma] [N/A:N/A] Primary Etiology: [1:Inflammatory] [N/A:N/A] Comorbid History: [1:Hypertension] [N/A:N/A] Date Acquired: [1:02/19/2019] [N/A:N/A] Weeks of Treatment: [1:3] [N/A:N/A] Wound Status: [1:Open] [N/A:N/A] Measurements L x W x D [1:3.3x1.2x1.6] [N/A:N/A] (cm) Area (cm) : [1:3.11] [N/A:N/A] Volume (cm) : [1:4.976] [N/A:N/A] % Reduction in Area: [1:-2.80%] [N/A:N/A] % Reduction in Volume: [1:34.20%] [N/A:N/A] Starting Position 1 [1:6] (o'clock): Ending Position 1 [1:12] (o'clock): Maximum Distance 1 [1:1.3] (cm): Undermining: [1:Yes] [N/A:N/A] Classification: [1:Full Thickness  With Exposed Support Structures] [N/A:N/A] Exudate Amount: [1:Medium] [N/A:N/A] Exudate Type: [1:Serosanguineous] [N/A:N/A] Exudate Color: [1:red, brown] [N/A:N/A] Wound Margin: [1:Epibole] [N/A:N/A] Granulation Amount: [1:Small (1-33%)] [N/A:N/A] Granulation Quality: [1:Pink, Pale] [N/A:N/A] Necrotic Amount: [1:Medium (34-66%)] [N/A:N/A] Exposed Structures: [1:Fat Layer (Subcutaneous Tissue) Exposed: Yes Tendon: Yes Bone: Yes Fascia: No Muscle: No Joint: No Small (1-33%)] [N/A:N/A N/A] Treatment Notes Wound #1 (Right Achilles) 3. Primary Dressing Applied Calcium Alginate Ag 4. Secondary Dressing ABD Pad Roll Gauze 7. Footwear/Offloading device applied Surgical shoe Electronic Signature(s) Signed: 06/09/2019 6:25:25 PM By: Baltazar Najjar MD Signed: 06/09/2019 6:38:14 PM By: Shawn Stall Entered By: Baltazar Najjar on 06/09/2019 18:13:52 -------------------------------------------------------------------------------- Multi-Disciplinary Care Plan Details Patient Name: Date of Service: MONTARIUS, GRIBBINS 06/09/2019 11:00 AM Medical Record [id number] Patient Account Number: [id number] Date of Birth/Sex: Treating RN: November 12, 1960 (58 y.o. [name] Primary Care Provider: Mee Hives Other  Clinician: Referring Provider: Treating Provider/Extender:[name], Patrice Paradise, [name] Weeks in Treatment: 3 Active Inactive Pain, Acute or Chronic Nursing Diagnoses: Pain, acute or chronic: actual or potential Potential alteration in comfort, pain Goals: Patient will verbalize adequate pain control and receive pain control interventions during procedures as needed Date Initiated: 05/13/2019 Target Resolution Date: 07/01/2019 Goal Status: Active Interventions: Encourage patient to take pain medications as prescribed Provide education on pain management Reposition patient for comfort Treatment Activities: Administer pain control measures as ordered : 05/13/2019 Notes: Wound/Skin Impairment Nursing Diagnoses: Knowledge deficit related to ulceration/compromised skin integrity Goals: Patient/caregiver will verbalize understanding of skin care regimen Date Initiated: 05/13/2019 Target Resolution Date: 07/01/2019 Goal Status: Active Interventions: Assess patient/caregiver ability to perform ulcer/skin care regimen upon admission and as needed Assess ulceration(s) every visit Provide education on smoking Provide education on ulcer and skin care Treatment Activities: Skin care regimen initiated : 05/13/2019 Topical wound management initiated : 05/13/2019 Notes: Electronic Signature(s) Signed: 06/09/2019 6:38:14 PM By: Shawn Stall Entered By: Shawn Stall on 06/09/2019 11:56:29 -------------------------------------------------------------------------------- Pain Assessment Details Patient Name: Date of Service: GABRYLE, WALDE 06/09/2019 11:00 AM Medical Record [id number] Patient Account Number: [id number] Date of Birth/Sex: Treating RN: 1961/02/20 (58 y.o. [name] Primary Care Provider: Mee Hives Other Clinician: Referring Provider: Treating Provider/Extender:[name], Patrice Paradise, [name] Weeks in Treatment: 3 Active Problems Location of Pain Severity  and Description of Pain Patient Has Paino Yes Site Locations Pain Location: Pain in Ulcers With Dressing Change: Yes Duration of the Pain. Constant / Intermittento Intermittent Rate the pain. Current Pain Level: 8 Character of Pain Describe the Pain: Throbbing Pain Management and Medication Current Pain Management: Medication: Yes Cold Application: No Rest: No Massage: No Activity: No T.E.N.S.: No Heat Application: No Leg drop or elevation: No Is the Current Pain Management Adequate: Adequate How does your wound impact your activities of daily livingo Sleep: No Bathing: No Appetite: No Relationship With Others: No Bladder Continence: No Emotions: No Bowel Continence: No Work: No Toileting: No Drive: No Dressing: No Hobbies: No Electronic Signature(s) Signed: 06/13/2019 2:18:43 PM By: Zandra Abts RN, BSN Entered By: Zandra Abts on 06/09/2019 11:50:56 -------------------------------------------------------------------------------- Patient/Caregiver Education Details Patient Name: Eldred Manges 11/19/2020andnbsp11:00 Date of Service: AM Medical Record [id number] Number: Patient Account Number: [id number] Treating RN: [birth year] (58 y.o. Shawn Stall Date of Birth/Gender: M) Other Clinician: Primary Care Physician: Mee Hives Treating Baltazar Najjar Referring Physician: Physician/Extender: Laurence Compton in Treatment: 3 Education Assessment Education Provided To: Patient Education Topics Provided Pain: Handouts: A Guide to Pain Control Methods: Explain/Verbal Responses: Reinforcements needed Electronic Signature(s) Signed: 06/09/2019 6:38:14 PM By: Shawn Stall Entered By: Shawn Stall  on 06/09/2019 11:56:39 -------------------------------------------------------------------------------- Wound Assessment Details Patient Name: Date of Service: DONYELL, DING 06/09/2019 11:00 AM Medical Record Number:[id number] Patient Account Number:  [id number] Date of Birth/Sex: Treating RN: Aug 22, 1960 (58 y.o. [name] Primary Care Provider: Mee Hives Other Clinician: Referring Provider: Treating Provider/Extender:[name], Patrice Paradise, [name] Weeks in Treatment: 3 Wound Status Wound Number: 1 Primary Etiology: Inflammatory Wound Location: Right Achilles Wound Status: Open Wounding Event: Trauma Comorbid History: Hypertension Date Acquired: 02/19/2019 Weeks Of Treatment: 3 Clustered Wound: No Photos Wound Measurements Length: (cm) 3.3 Width: (cm) 1.2 Depth: (cm) 1.6 Area: (cm) 3.11 Volume: (cm) 4.976 % Reduction in Area: -2.8% % Reduction in Volume: 34.2% Epithelialization: Small (1-33%) Undermining: Yes Starting Position (o'clock): 6 Ending Position (o'clock): 12 Maximum Distance: (cm) 1.3 Wound Description Classification: Full Thickness With Exposed Support Structures Classification: Structures Wound Epibole Margin: Exudate Medium Amount: Exudate Type: Serosanguineous Exudate Color: red, brown Wound Bed Granulation Amount: Small (1-33%) Granulation Quality: Pink, Pale Necrotic Amount: Medium (34-66%) Necrotic Quality: Adherent Slough Foul Odor After Cleansing: No Slough/Fibrino Yes Exposed Structure Fascia Exposed: No Fat Layer (Subcutaneous Tissue) Exposed: Yes Tendon Exposed: Yes Muscle Exposed: No Joint Exposed: No Bone Exposed: Yes Electronic Signature(s) Signed: 06/15/2019 2:51:44 PM By: Benjaman Kindler EMT/HBOT Signed: 06/29/2019 12:09:06 PM By: Zandra Abts RN, BSN Previous Signature: 06/13/2019 2:18:43 PM Version By: Zandra Abts RN, BSN Entered By: Benjaman Kindler on 06/15/2019 08:09:17 -------------------------------------------------------------------------------- Vitals Details Patient Name: Date of Service: WILKIN, LIPPY 06/09/2019 11:00 AM Medical Record [id number] Patient Account Number: [id number] Date of Birth/Sex: Treating RN: Nov 30, 1960 (58 y.o. [name] Primary Care Provider: Mee Hives Other Clinician: Referring Provider: Treating Provider/Extender:[name], Patrice Paradise, [name] Weeks in Treatment: 3 Vital Signs Time Taken: 11:52 Temperature (F): 98.0 Height (in): 73 Pulse (bpm): 48 Weight (lbs): 230 Respiratory Rate (breaths/min): 18 Body Mass Index (BMI): 30.3 Blood Pressure (mmHg): 131/82 Reference Range: 80 - 120 mg / dl Electronic Signature(s) Signed: 06/13/2019 2:18:43 PM By: Zandra Abts RN, BSN Entered By: Zandra Abts on 06/09/2019 11:53:06

## 2019-06-14 ENCOUNTER — Encounter: Payer: Medicaid Other | Admitting: Vascular Surgery

## 2019-06-14 ENCOUNTER — Encounter (HOSPITAL_COMMUNITY): Payer: Medicaid Other

## 2019-06-15 ENCOUNTER — Ambulatory Visit (HOSPITAL_COMMUNITY)
Admission: RE | Admit: 2019-06-15 | Discharge: 2019-06-15 | Disposition: A | Discharge status: Home / Self Care | Payer: Medicaid Other | Source: Ambulatory Visit | Attending: Vascular Surgery | Admitting: Vascular Surgery

## 2019-06-15 ENCOUNTER — Other Ambulatory Visit: Payer: Self-pay

## 2019-06-15 ENCOUNTER — Encounter: Payer: Self-pay | Admitting: Vascular Surgery

## 2019-06-15 ENCOUNTER — Ambulatory Visit (INDEPENDENT_AMBULATORY_CARE_PROVIDER_SITE_OTHER): Payer: Medicaid Other | Admitting: Vascular Surgery

## 2019-06-15 VITALS — Ht 73.0 in | Wt 200.0 lb

## 2019-06-15 DIAGNOSIS — L97909 Non-pressure chronic ulcer of unspecified part of unspecified lower leg with unspecified severity: Secondary | ICD-10-CM | POA: Diagnosis not present

## 2019-06-15 DIAGNOSIS — I70299 Other atherosclerosis of native arteries of extremities, unspecified extremity: Secondary | ICD-10-CM | POA: Insufficient documentation

## 2019-06-15 NOTE — Progress Notes (Signed)
Patient name: Mitchell Richard DOB: 1961/07/09 Sex: male    Referring Provider is [name], Audrea Muscat, NP  PCP is [name], Audrea Muscat, NP  REASON FOR VIRTUAL VISIT: Follow-up of nonhealing wound of the right leg.  I connected with Mitchell Richard on 06/15/19 at  3:00 PM EST by a video enabled telemedicine application and verified that I am speaking with the correct person using two identifiers. I discussed the limitations of evaluation and management by telemedicine and the availability of in person appointments. The patient expressed understanding and agreed to proceed.  Location: Patient: Home Provider: Office  HPI: Mitchell Richard is a 58 y.o. male who I saw on 05/25/2019 with a nonhealing right leg wound and peripheral vascular disease.  I initially saw the patient on 05/25/2019 with a nonhealing wound of the right Achilles which had opened up about 2 months ago.  He actually had had this wound for a long time.  Toe pressure on the right was 56 mmHg suggesting marginal circulation for healing.  For this reason I recommended an arteriogram.  A picture of the wound is in my note dated 05/25/2019.  The wound is fairly deep.  He underwent an arteriogram on 05/27/2019.  This showed that on the right side, which is the side of concern, the common femoral and deep femoral artery were patent.  There was minimal stenosis of the proximal deep femoral artery.  There was a flush occlusion of the right superficial femoral artery and reconstitution of the above-knee popliteal artery with three-vessel runoff on the right.  There was poor runoff onto the foot however with an occluded posterior tibial artery and dorsalis pedis artery.  I felt that he would need preoperative cardiac clearance, vein mapping, and then a right femoral to above-knee popliteal artery bypass.  He came in today for his vein map and then had to leave so we are doing a phone visit.  He has no new complaints.  The wound has not changed significantly  according to the patient.  Current Outpatient Medications  Medication Sig Dispense Refill  . acetaminophen (TYLENOL) 325 MG tablet Take 650 mg by mouth every 6 (six) hours as needed for moderate pain.    Marland Kitchen atorvastatin (LIPITOR) 10 MG tablet Take 1 tablet (10 mg total) by mouth daily. 30 tablet 11  . ibuprofen (ADVIL) 800 MG tablet Take 1 tablet (800 mg total) by mouth every 8 (eight) hours as needed. 21 tablet 0  . lisinopril (ZESTRIL) 20 MG tablet Take 20 mg by mouth daily.     No current facility-administered medications for this visit.    REVIEW OF SYSTEMS: Valu.Nieves ] denotes positive finding; [  ] denotes negative finding  CARDIOVASCULAR:  [ ]  chest pain   [ ]  dyspnea on exertion  [ ]  leg swelling  CONSTITUTIONAL:  [ ]  fever   [ ]  chills  OBSERVATIONS/OBJECTIVE: There were no vitals filed for this visit. The patient was alert and oriented.  He was not short of breath on the phone.  DATA:  ARTERIOGRAPHY: I reviewed his arteriogram.  This shows a flush occlusion of the right superficial femoral artery with reconstitution of the above-knee popliteal artery.  There is some mild disease in the popliteal artery around the knee.  There is three-vessel runoff on the right.  VEIN MAP: I independently interpreted his vein map today.  On the right side the great saphenous vein is marginal in size with diameters ranging from 0.31-0.41 cm.  On the left  side the vein is quite small.  The right great saphenous vein is clearly the better conduit.  MEDICAL ISSUES:  CRITICAL LIMB ISCHEMIA: This patient has a nonhealing wound of the right Achilles with an occluded superficial femoral artery.  I think this is a limb threatening situation and I have recommended that we proceed with a right femoral to above-knee popliteal artery bypass.  I have explained that the vein is marginal in size but that certainly if this appears usable I would prefer to use his vein as the bypass conduit.  Otherwise would have to use a  prosthetic graft.  I have reviewed the indications for lower extremity bypass. I have also reviewed the potential complications of surgery including but not limited to: wound healing problems, infection, graft thrombosis, limb loss, or other unpredictable medical problems. All the patient's questions were answered and they are agreeable to proceed.  He was seen by his cardiologist at the [facility] and if we can obtain these records and he is cleared from a cardiac standpoint I plan on proceeding with his right femoropopliteal bypass on Tuesday, 06/21/2019.   FOLLOW UP INSTRUCTIONS:   I discussed the assessment and treatment plan with the patient. The patient was provided an opportunity to ask questions and all were answered. The patient agreed with the plan and demonstrated an understanding of the instructions. The patient was advised to call back or seek an in-person evaluation if the symptoms worsen or if the condition fails to improve as anticipated.  I provided 20 minutes of non-face-to-face time during this encounter.  Waverly Ferrari [facility]

## 2019-06-22 ENCOUNTER — Encounter (HOSPITAL_COMMUNITY): Payer: Medicaid Other

## 2019-06-22 ENCOUNTER — Encounter: Payer: Medicaid Other | Admitting: Vascular Surgery

## 2019-06-23 ENCOUNTER — Encounter: Payer: Self-pay | Admitting: Cardiovascular Disease

## 2019-06-23 ENCOUNTER — Encounter (HOSPITAL_BASED_OUTPATIENT_CLINIC_OR_DEPARTMENT_OTHER): Payer: Medicaid Other | Attending: Internal Medicine | Admitting: Internal Medicine

## 2019-06-23 ENCOUNTER — Other Ambulatory Visit: Payer: Self-pay

## 2019-06-23 ENCOUNTER — Telehealth: Payer: Self-pay | Admitting: *Deleted

## 2019-06-23 DIAGNOSIS — G9009 Other idiopathic peripheral autonomic neuropathy: Secondary | ICD-10-CM | POA: Insufficient documentation

## 2019-06-23 DIAGNOSIS — L942 Calcinosis cutis: Secondary | ICD-10-CM | POA: Diagnosis not present

## 2019-06-23 DIAGNOSIS — I70234 Atherosclerosis of native arteries of right leg with ulceration of heel and midfoot: Secondary | ICD-10-CM | POA: Diagnosis not present

## 2019-06-23 DIAGNOSIS — L97414 Non-pressure chronic ulcer of right heel and midfoot with necrosis of bone: Secondary | ICD-10-CM | POA: Insufficient documentation

## 2019-06-23 DIAGNOSIS — I10 Essential (primary) hypertension: Secondary | ICD-10-CM | POA: Insufficient documentation

## 2019-06-23 DIAGNOSIS — Z8614 Personal history of Methicillin resistant Staphylococcus aureus infection: Secondary | ICD-10-CM | POA: Diagnosis not present

## 2019-06-23 NOTE — Telephone Encounter (Signed)
Spoke with patient regarding Cardiac MRI appointment 07/19/19 at 3:00pm at [facility]----arrival time is 8:15 am 1st floor admissions office---Will also mail information to patient
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
## 2019-06-24 NOTE — Progress Notes (Signed)
CRUSOE, OATLEY ([id number]) Visit Report for 06/23/2019 Arrival Information Details Patient Name: Date of Service: Mitchell Richard, Mitchell Richard 06/23/2019 10:15 AM Medical Record [id number] Patient Account Number: [id number] Date of Birth/Sex: Treating RN: [birth year] (58 y.o. [name] Primary Care Provider: Mee Hives Other Clinician: Referring Provider: Treating Provider/Extender:[name], Patrice Paradise, [name] Weeks in Treatment: 5 Visit Information History Since Last Visit All ordered tests and consults were completed: Yes Patient Arrived: [place] Mor Added or deleted any medications: Yes Arrival Time: 11:21 brother Any new allergies or adverse reactions: No Accompanied By: Had a fall or experienced change in No Transfer Assistance: None activities of daily living that may affect Patient Identification Verified: Yes risk of falls: Secondary Verification Process Completed: Yes Signs or symptoms of abuse/neglect since last No Patient Requires Transmission-Based No visito Precautions: Hospitalized since last visit: No Patient Has Alerts: No Implantable device outside of the clinic excluding No cellular tissue based products placed in the center since last visit: Has Dressing in Place as Prescribed: Yes Pain Present Now: Yes Electronic Signature(s) Signed: 06/23/2019 5:56:19 PM By: Zenaida Deed RN, BSN Entered By: Zenaida Deed on 06/23/2019 11:24:34 -------------------------------------------------------------------------------- Clinic Level of Care Assessment Details Patient Name: Date of Service: Mitchell Richard, Mitchell Richard 06/23/2019 10:15 AM Medical Record [id number] Patient Account Number: [id number] Date of Birth/Sex: Treating RN: 04-06-1961 (58 y.o. [name] Primary Care Provider: Mee Hives Other Clinician: Referring Provider: Treating Provider/Extender:[name], Patrice Paradise, [name] Weeks in Treatment: 5 Clinic Level of Care Assessment Items TOOL 4 Quantity  Score X - Use when only an EandM is performed on FOLLOW-UP visit 1 0 ASSESSMENTS - Nursing Assessment / Reassessment X - Reassessment of Co-morbidities (includes updates in patient status) 1 10 X - Reassessment of Adherence to Treatment Plan 1 5 ASSESSMENTS - Wound and Skin Assessment / Reassessment []  - Simple Wound Assessment / Reassessment - one wound 0 X - Complex Wound Assessment / Reassessment - multiple wounds 1 5 X - Dermatologic / Skin Assessment (not related to wound area) 1 10 ASSESSMENTS - Focused Assessment X - Circumferential Edema Measurements - multi extremities 1 5 X - Nutritional Assessment / Counseling / Intervention 1 10 []  - Lower Extremity Assessment (monofilament, tuning fork, pulses) 0 []  - Peripheral Arterial Disease Assessment (using hand held doppler) 0 ASSESSMENTS - Ostomy and/or Continence Assessment and Care []  - Incontinence Assessment and Management 0 []  - Ostomy Care Assessment and Management (repouching, etc.) 0 PROCESS - Coordination of Care []  - Simple Patient / Family Education for ongoing care 0 X - Complex (extensive) Patient / Family Education for ongoing care 1 20 X - Staff obtains Chiropractor, Records, Test Results / Process Orders 1 10 []  - Staff telephones HHA, Nursing Homes / Clarify orders / etc 0 []  - Routine Transfer to another Facility (non-emergent condition) 0 []  - Routine Hospital Admission (non-emergent condition) 0 []  - New Admissions / Manufacturing engineer / Ordering NPWT, Apligraf, etc. 0 []  - Emergency Hospital Admission (emergent condition) 0 []  - Simple Discharge Coordination 0 X - Complex (extensive) Discharge Coordination 1 15 PROCESS - Special Needs []  - Pediatric / Minor Patient Management 0 []  - Isolation Patient Management 0 []  - Hearing / Language / Visual special needs 0 []  - Assessment of Community assistance (transportation, D/C planning, etc.) 0 []  - Additional assistance / Altered mentation 0 []  - Support  Surface(s) Assessment (bed, cushion, seat, etc.) 0 INTERVENTIONS - Wound Cleansing / Measurement []  - Simple Wound Cleansing - one wound 0 X - Complex Wound Cleansing -  multiple wounds 1 5 X - Wound Imaging (photographs - any number of wounds) 1 5 []  - Wound Tracing (instead of photographs) 0 []  - Simple Wound Measurement - one wound 0 X - Complex Wound Measurement - multiple wounds 1 5 INTERVENTIONS - Wound Dressings []  - Small Wound Dressing one or multiple wounds 0 X - Medium Wound Dressing one or multiple wounds 1 15 []  - Large Wound Dressing one or multiple wounds 0 []  - Application of Medications - topical 0 []  - Application of Medications - injection 0 INTERVENTIONS - Miscellaneous []  - External ear exam 0 []  - Specimen Collection (cultures, biopsies, blood, body fluids, etc.) 0 []  - Specimen(s) / Culture(s) sent or taken to Lab for analysis 0 []  - Patient Transfer (multiple staff / Civil Service fast streamer / Similar devices) 0 []  - Simple Staple / Suture removal (25 or less) 0 []  - Complex Staple / Suture removal (26 or more) 0 []  - Hypo / Hyperglycemic Management (close monitor of Blood Glucose) 0 []  - Ankle / Brachial Index (ABI) - do not check if billed separately 0 X - Vital Signs 1 5 Has the patient been seen at the hospital within the last three years: Yes Total Score: 125 Level Of Care: New/Established - Level 4 Electronic Signature(s) Signed: 06/23/2019 6:37:14 PM By: Deon Pilling Entered By: Deon Pilling on 06/23/2019 15:12:13 -------------------------------------------------------------------------------- Encounter Discharge Information Details Patient Name: Date of Service: Mitchell Richard, Mitchell Richard 06/23/2019 10:15 AM Medical Record [id number] Patient Account Number: [id number] Date of Birth/Sex: Treating RN: 12-Jun-1961 (58 y.o. Ernestene Mention Primary Care Provider: Jack Quarto Other Clinician: Referring Provider: Treating Provider/Extender:[name], Wynn Maudlin,  [name] Weeks in Treatment: 5 Encounter Discharge Information Items Discharge Condition: Stable Ambulatory Status: Cane Discharge Destination: Home Transportation: Private Auto Accompanied By: brother Schedule Follow-up Appointment: Yes Clinical Summary of Care: Patient Declined Electronic Signature(s) Signed: 06/23/2019 5:56:19 PM By: Baruch Gouty RN, BSN Entered By: Baruch Gouty on 06/23/2019 12:16:34 -------------------------------------------------------------------------------- Lower Extremity Assessment Details Patient Name: Date of Service: Mitchell Richard, Mitchell Richard 06/23/2019 10:15 AM Medical Record [id number] Patient Account Number: [id number] Date of Birth/Sex: Treating RN: Dec 16, 1960 (58 y.o. Ernestene Mention Primary Care Provider: Jack Quarto Other Clinician: Referring Provider: Treating Provider/Extender:[name], Wynn Maudlin, [name] Weeks in Treatment: 5 Edema Assessment Assessed: [Left: No] [Right: No] Edema: [Left: N] [Right: o] Calf Left: Right: Point of Measurement: 44 cm From Medial Instep cm 36 cm Ankle Left: Right: Point of Measurement: 11 cm From Medial Instep cm 22 cm Vascular Assessment Pulses: Dorsalis Pedis Palpable: [Right:Yes] Electronic Signature(s) Signed: 06/23/2019 5:56:19 PM By: Baruch Gouty RN, BSN Entered By: Baruch Gouty on 06/23/2019 11:33:17 -------------------------------------------------------------------------------- Multi Wound Chart Details Patient Name: Date of Service: Mitchell Richard, Mitchell Richard 06/23/2019 10:15 AM Medical Record [id number] Patient Account Number: [id number] Date of Birth/Sex: Treating RN: [birth year] (58 y.o. M) Primary Care Provider: Jack Quarto Other Clinician: Referring Provider: Treating Provider/Extender:[name], Wynn Maudlin, [name] Weeks in Treatment: 5 Vital Signs Height(in): 73 Pulse(bpm): 61 Weight(lbs): 230 Blood Pressure(mmHg): 138/93 Body Mass Index(BMI): 30 Temperature(F):  98 Respiratory 18 Rate(breaths/min): Photos: [1:No Photos] [N/A:N/A] Wound Location: [1:Right Achilles] [N/A:N/A] Wounding Event: [1:Trauma] [N/A:N/A] Primary Etiology: [1:Inflammatory] [N/A:N/A] Comorbid History: [1:Hypertension] [N/A:N/A] Date Acquired: [1:02/19/2019] [N/A:N/A] Weeks of Treatment: [1:5] [N/A:N/A] Wound Status: [1:Open] [N/A:N/A] Measurements L x W x D [1:3.2x0.8x1.2] [N/A:N/A] (cm) Area (cm) : [1:2.011] [N/A:N/A] Volume (cm) : [1:2.413] [N/A:N/A] % Reduction in Area: [1:33.50%] [N/A:N/A] % Reduction in Volume: [1:68.10%] [N/A:N/A] Position 1 (o'clock): [1:12] Tunneling: [1:Yes] [N/A:N/A] Classification: [1:Full Thickness With Exposed Support Structures] [N/A:N/A]  Exudate Amount: [1:Medium] [N/A:N/A] Exudate Type: [1:Serosanguineous] [N/A:N/A] Exudate Color: [1:red, brown] [N/A:N/A] Wound Margin: [1:Epibole] [N/A:N/A] Granulation Amount: [1:Small (1-33%)] [N/A:N/A] Granulation Quality: [1:Pink, Pale] [N/A:N/A] Necrotic Amount: [1:Medium (34-66%)] [N/A:N/A] Exposed Structures: [1:Fat Layer (Subcutaneous N/A Tissue) Exposed: Yes Tendon: Yes Bone: Yes Fascia: No Muscle: No Joint: No Small (1-33%)] [N/A:N/A] Treatment Notes Wound #1 (Right Achilles) 3. Primary Dressing Applied Other primary dressing (specifiy in notes) 4. Secondary Dressing ABD Pad Dry Gauze Roll Gauze 5. Secured With Tape 6. Support Layer Nurse, learning disabilityApplied Ace Wrap 7. Footwear/Offloading device applied Surgical shoe Notes betadine moistened gauze Electronic Signature(s) Signed: 06/24/2019 7:57:15 AM By: [name], [name] MD Entered By: [name], [name] on 06/23/2019 13:11:30 -------------------------------------------------------------------------------- Multi-Disciplinary Care Plan Details Patient Name: Date of Service: Mitchell Richard, Matthias 06/23/2019 10:15 AM Medical Record [id number] Patient Account Number: [id number] Date of Birth/Sex: Treating RN: 12/03/1960 (58 y.o. [name]) [name],  [name] Primary Care Provider: Mee HivesPLACEY, [name] Other Clinician: Referring Provider: Treating Provider/Extender:[name], [name] [name], [name] Weeks in Treatment: 5 Active Inactive Pain, Acute or Chronic Nursing Diagnoses: Pain, acute or chronic: actual or potential Potential alteration in comfort, pain Goals: Patient will verbalize adequate pain control and receive pain control interventions during procedures as needed Date Initiated: 05/13/2019 Target Resolution Date: 07/01/2019 Goal Status: Active Interventions: Encourage patient to take pain medications as prescribed Provide education on pain management Reposition patient for comfort Treatment Activities: Administer pain control measures as ordered : 05/13/2019 Notes: Wound/Skin Impairment Nursing Diagnoses: Knowledge deficit related to ulceration/compromised skin integrity Goals: Patient/caregiver will verbalize understanding of skin care regimen Date Initiated: 05/13/2019 Target Resolution Date: 07/01/2019 Goal Status: Active Interventions: Assess patient/caregiver ability to perform ulcer/skin care regimen upon admission and as needed Assess ulceration(s) every visit Provide education on smoking Provide education on ulcer and skin care Treatment Activities: Skin care regimen initiated : 05/13/2019 Topical wound management initiated : 05/13/2019 Notes: Electronic Signature(s) Signed: 06/23/2019 6:37:14 PM By: [name], [name] Entered By: [name], [name] on 06/23/2019 11:23:53 -------------------------------------------------------------------------------- Pain Assessment Details Patient Name: Date of Service: Mitchell Richard, Mitchell Richard 06/23/2019 10:15 AM Medical Record [id number] Patient Account Number: [id number] Date of Birth/Sex: Treating RN: 12/03/1960 (58 y.o. Damaris SchoonerM) Boehlein, Linda Primary Care Provider: Mee HivesPLACEY, [name] Other Clinician: Referring Provider: Treating Provider/Extender:[name], [name] [name], [name] Weeks in  Treatment: 5 Active Problems Location of Pain Severity and Description of Pain Patient Has Paino Yes Site Locations Pain Location: Pain in Ulcers With Dressing Change: Yes Duration of the Pain. Constant / Intermittento Constant Rate the pain. Current Pain Level: 8 Worst Pain Level: 10 Least Pain Level: 5 Character of Pain Describe the Pain: Sharp, Shooting, Throbbing Pain Management and Medication Current Pain Management: Medication: Yes Is the Current Pain Management Adequate: Adequate How does your wound impact your activities of daily livingo Sleep: Yes Bathing: No Appetite: No Relationship With Others: No Bladder Continence: No Emotions: Yes Bowel Continence: No Hobbies: No Toileting: No Dressing: No Electronic Signature(s) Signed: 06/23/2019 5:56:19 PM By: Zenaida DeedBoehlein, Linda RN, BSN Entered By: Zenaida DeedBoehlein, Linda on 06/23/2019 11:25:56 -------------------------------------------------------------------------------- Patient/Caregiver Education Details Patient Name: Date of Service: Mitchell Richard, Mitchell Richard 12/3/2020andnbsp10:15 AM Medical Record [id number] Patient Account Number: [id number] Date of Birth/Gender: 12/03/1960 (58 y.o. M) Treating RN: [name], [name] Primary Care Physician: Mee HivesPLACEY, [name] Other Clinician: Referring Physician: Treating Physician/Extender:[name], [name] [name], Emmaline KluverMARY Weeks in Treatment: 5 Education Assessment Education Provided To: Patient Education Topics Provided Pain: Handouts: A Guide to Pain Control Methods: Explain/Verbal Responses: Reinforcements needed Electronic Signature(s) Signed: 06/23/2019 6:37:14 PM By: [name], [name] Entered By: [name], [name] on 06/23/2019 11:24:03 -------------------------------------------------------------------------------- Wound Assessment Details Patient  Name: Date of Service: Mitchell Richard, Mitchell Richard 06/23/2019 10:15 AM Medical Record [id number] Patient Account Number: [id number] Date of Birth/Sex: Treating  RN: [birth year] (58 y.o. [name] Primary Care Provider: Mee Hives Other Clinician: Referring Provider: Treating Provider/Extender:[name], Patrice Paradise, [name] Weeks in Treatment: 5 Wound Status Wound Number: 1 Primary Etiology: Inflammatory Wound Location: Right Achilles Wound Status: Open Wounding Event: Trauma Comorbid History: Hypertension Date Acquired: 02/19/2019 Weeks Of Treatment: 5 Clustered Wound: No Photos Wound Measurements Length: (cm) 3.2 Width: (cm) 0.8 Depth: (cm) 1.2 Area: (cm) 2.011 Volume: (cm) 2.413 % Reduction in Area: 33.5% % Reduction in Volume: 68.1% Epithelialization: Small (1-33%) Tunneling: Yes Position (o'clock): 12 Undermining: No Wound Description Classification: Full Thickness With Exposed Support Structures Wound Epibole Margin: Exudate Medium Amount: Exudate Type: Serosanguineous Exudate Color: red, brown Wound Bed Granulation Amount: Small (1-33%) Granulation Quality: Pink, Pale Necrotic Amount: Medium (34-66%) Necrotic Quality: Adherent Slough Foul Odor After Cleansing: No Slough/Fibrino Yes Exposed Structure Fascia Exposed: No Fat Layer (Subcutaneous Tissue) Exposed: Yes Tendon Exposed: Yes Muscle Exposed: No Joint Exposed: No Bone Exposed: Yes Treatment Notes Wound #1 (Right Achilles) 3. Primary Dressing Applied Other primary dressing (specifiy in notes) 4. Secondary Dressing ABD Pad Dry Gauze Roll Gauze 5. Secured With Tape 6. Support Layer Nurse, learning disability 7. Footwear/Offloading device applied Surgical shoe Notes betadine moistened gauze Electronic Signature(s) Signed: 06/23/2019 3:43:57 PM By: Benjaman Kindler EMT/HBOT Signed: 06/23/2019 5:56:19 PM By: Zenaida Deed RN, BSN Entered By: Benjaman Kindler on 06/23/2019 14:45:41 -------------------------------------------------------------------------------- Vitals Details Patient Name: Date of Service: Mitchell Richard, Mitchell Richard 06/23/2019 10:15 AM Medical  Record [id number] Patient Account Number: [id number] Date of Birth/Sex: Treating RN: [birth year] (58 y.o. [name] Primary Care Provider: Mee Hives Other Clinician: Referring Provider: Treating Provider/Extender:[name], Patrice Paradise, [name] Weeks in Treatment: 5 Vital Signs Time Taken: 11:24 Temperature (F): 98 Height (in): 73 Pulse (bpm): 61 Source: Stated Respiratory Rate (breaths/min): 18 Weight (lbs): 230 Blood Pressure (mmHg): 138/93 Source: Stated Reference Range: 80 - 120 mg / dl Body Mass Index (BMI): 30.3 Electronic Signature(s) Signed: 06/23/2019 5:56:19 PM By: Zenaida Deed RN, BSN Entered By: Zenaida Deed on 06/23/2019 11:25:12

## 2019-06-27 ENCOUNTER — Encounter: Payer: Self-pay | Admitting: Vascular Surgery

## 2019-06-28 NOTE — Progress Notes (Signed)
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

## 2019-06-28 NOTE — Progress Notes (Signed)
Mitchell Richard, Mitchell Richard ([id number]) Visit Report for 05/13/2019 Allergy List Details Patient Name: Date of Service: Mitchell Richard, Mitchell Richard 05/13/2019 10:30 Mitchell Richard Medical Record [id number] Patient Account Number: [id number] Date of Birth/Sex: Treating RN: 07-12-1961 (58 y.o. Mitchell Richard) [name] Primary Care Provider: Mee Hives Other Clinician: Referring Provider: Treating Provider/Extender:[name], Patrice Paradise, [name] Weeks in Treatment: 0 Allergies Active Allergies No Known Allergies Allergy Notes Electronic Signature(s) Signed: 06/28/2019 3:01:15 PM By: [name] RN Entered By: [name] on 05/13/2019 10:48:16 -------------------------------------------------------------------------------- Arrival Information Details Patient Name: Date of Service: Mitchell Richard, Mitchell Richard 05/13/2019 10:30 Mitchell Richard Medical Record [id number] Patient Account Number: [id number] Date of Birth/Sex: Treating RN: 04-20-1961 (58 y.o. Mitchell Richard Primary Care Provider: Mee Hives Other Clinician: Referring Provider: Treating Provider/Extender:[name], Patrice Paradise, [name] Weeks in Treatment: 0 Visit Information Patient Arrived: Crutches Arrival Time: 10:42 Accompanied By: self Transfer Assistance: None Patient Identification Verified: Yes Secondary Verification Process Completed: Yes Patient Requires Transmission-Based No Precautions: Patient Has Alerts: No Electronic Signature(s) Signed: 06/28/2019 3:01:15 PM By: [name] RN Entered By: [name] on 05/13/2019 10:43:31 -------------------------------------------------------------------------------- Encounter Discharge Information Details Patient Name: Date of Service: Mitchell Richard, Mitchell Richard 05/13/2019 10:30 Mitchell Richard Medical Record [id number] Patient Account Number: [id number] Date of Birth/Sex: Treating RN: 03-29-1961 (58 y.o. Mitchell Richard Primary Care Provider: Mee Hives Other Clinician: Referring Provider: Treating Provider/Extender:[name],  Patrice Paradise, [name] Weeks in Treatment: 0 Encounter Discharge Information Items Discharge Condition: Stable Ambulatory Status: Crutches Discharge Destination: Home Transportation: Private Auto Accompanied By: self Schedule Follow-up Appointment: Yes Clinical Summary of Care: Patient Declined Electronic Signature(s) Signed: 05/16/2019 5:21:50 PM By: Cherylin Mylar Entered By: Cherylin Mylar on 05/13/2019 18:58:24 -------------------------------------------------------------------------------- Lower Extremity Assessment Details Patient Name: Date of Service: Mitchell Richard, Mitchell Richard 05/13/2019 10:30 Mitchell Richard Medical Record [id number] Patient Account Number: [id number] Date of Birth/Sex: Treating RN: Mar 16, 1961 (58 y.o. Mitchell Richard Primary Care Provider: Mee Hives Other Clinician: Referring Provider: Treating Provider/Extender:[name], Patrice Paradise, [name] Weeks in Treatment: 0 Edema Assessment Assessed: [Left: No] [Richard: Yes] Edema: [Left: Ye] [Richard: s] Calf Left: Richard: Point of Measurement: 44 cm From Medial Instep cm 35 cm Ankle Left: Richard: Point of Measurement: 11 cm From Medial Instep cm 23 cm Vascular Assessment Blood Pressure: Brachial: [Richard:151] Ankle: [Richard:Posterior Tibial: 102 0.68] Electronic Signature(s) Signed: 06/28/2019 3:01:15 PM By: [name] RN Entered By: [name] on 05/13/2019 11:09:18 -------------------------------------------------------------------------------- Multi Wound Chart Details Patient Name: Date of Service: Mitchell Richard, Mitchell Richard 05/13/2019 10:30 Mitchell Richard Medical Record [id number] Patient Account Number: [id number] Date of Birth/Sex: Treating RN: [birth year] (58 y.o. Mitchell Richard Primary Care Provider: Mee Hives Other Clinician: Referring Provider: Treating Provider/Extender:[name], Patrice Paradise, [name] Weeks in Treatment: 0 Vital Signs Height(in): 73 Pulse(bpm): 62 Weight(lbs): 230 Blood Pressure(mmHg): 151/96 Body  Mass Index(BMI): 30 Temperature(F): 98.3 Respiratory 18 Rate(breaths/min): Photos: [1:No Photos] [N/A:N/A] Wound Location: [1:Richard Achilles] [N/A:N/A] Wounding Event: [1:Trauma] [N/A:N/A] Primary Etiology: [1:Inflammatory] [N/A:N/A] Comorbid History: [1:Hypertension] [N/A:N/A] Date Acquired: [1:02/19/2019] [N/A:N/A] Weeks of Treatment: [1:0] [N/A:N/A] Wound Status: [1:Open] [N/A:N/A] Measurements L x W x D [1:3.5x1.1x2.5] [N/A:N/A] (cm) Area (cm) : [1:3.024] [N/A:N/A] Volume (cm) : [1:7.559] [N/A:N/A] % Reduction in Area: [1:0.00%] [N/A:N/A] % Reduction in Volume: [1:0.00%] [N/A:N/A] Starting Position 1 [1:6] (o'clock): Ending Position 1 [1:12] (o'clock): Maximum Distance 1 [1:2] (cm): Undermining: [1:Yes] [N/A:N/A] Classification: [1:Full Thickness With Exposed Support Structures] [N/A:N/A] Exudate Amount: [1:Small] [N/A:N/A] Exudate Type: [1:Serosanguineous] [N/A:N/A] Exudate Color: [1:red, brown] [N/A:N/A] Granulation Amount: [1:Medium (34-66%)] [N/A:N/A] Necrotic Amount: [1:Medium (34-66%)] [N/A:N/A] Exposed Structures: [1:Fat Layer (Subcutaneous N/A Tissue) Exposed: Yes Tendon: Yes Bone: Yes Fascia: No Muscle: No Joint:  No] Treatment Notes Electronic Signature(s) Signed: 05/13/2019 5:59:58 PM By: Levan Hurst RN, BSN Signed: 05/13/2019 6:06:00 PM By: Linton Ham MD Entered By: Linton Ham on 05/13/2019 12:03:30 -------------------------------------------------------------------------------- Multi-Disciplinary Care Plan Details Patient Name: Date of Service: Mitchell Richard, Mitchell Richard 05/13/2019 10:30 Mitchell Richard Medical Record [id number] Patient Account Number: [id number] Date of Birth/Sex: Treating RN: December 25, 1960 (58 y.o. Mitchell Richard Primary Care Provider: Jack Quarto Other Clinician: Referring Provider: Treating Provider/Extender:[name], Wynn Maudlin, [name] Weeks in Treatment: 0 Active Inactive Orientation to the Wound Care Program Nursing  Diagnoses: Knowledge deficit related to the wound healing center program Goals: Patient/caregiver will verbalize understanding of the [facility] Date Initiated: 05/13/2019 Target Resolution Date: 05/27/2019 Goal Status: Active Interventions: Provide education on orientation to the wound center Notes: Pain, Acute or Chronic Nursing Diagnoses: Pain, acute or chronic: actual or potential Potential alteration in comfort, pain Goals: Patient will verbalize adequate pain control and receive pain control interventions during procedures as needed Date Initiated: 05/13/2019 Target Resolution Date: 05/27/2019 Goal Status: Active Interventions: Encourage patient to take pain medications as prescribed Provide education on pain management Reposition patient for comfort Treatment Activities: Administer pain control measures as ordered : 05/13/2019 Notes: Wound/Skin Impairment Nursing Diagnoses: Knowledge deficit related to ulceration/compromised skin integrity Goals: Patient/caregiver will verbalize understanding of skin care regimen Date Initiated: 05/13/2019 Target Resolution Date: 05/27/2019 Goal Status: Active Interventions: Assess patient/caregiver ability to perform ulcer/skin care regimen upon admission and as needed Assess ulceration(s) every visit Provide education on smoking Provide education on ulcer and skin care Treatment Activities: Skin care regimen initiated : 05/13/2019 Topical wound management initiated : 05/13/2019 Notes: Electronic Signature(s) Signed: 05/16/2019 5:31:38 PM By: Deon Pilling Entered By: Deon Pilling on 05/13/2019 11:41:26 -------------------------------------------------------------------------------- Pain Assessment Details Patient Name: Date of Service: Mitchell Richard, Mitchell Richard 05/13/2019 10:30 Mitchell Richard Medical Record [id number] Patient Account Number: [id number] Date of Birth/Sex: Treating RN: Jun 10, 1961 (58 y.o. Oval Linsey Primary Care Provider: Jack Quarto Other Clinician: Referring Provider: Treating Provider/Extender:[name], Wynn Maudlin, [name] Weeks in Treatment: 0 Active Problems Location of Pain Severity and Description of Pain Patient Has Paino Yes Site Locations With Dressing Change: Yes Duration of the Pain. Constant / Intermittento Constant Rate the pain. Current Pain Level: 8 Worst Pain Level: 10 Least Pain Level: 0 Tolerable Pain Level: 5 Character of Pain Describe the Pain: Throbbing Pain Management and Medication Current Pain Management: Medication: Yes Cold Application: No Rest: Yes Massage: No Activity: No T.E.N.S.: No Heat Application: No Leg drop or elevation: No Is the Current Pain Management Adequate: Inadequate How does your wound impact your activities of daily livingo Sleep: Yes Bathing: No Appetite: No Relationship With Others: No Bladder Continence: No Emotions: No Bowel Continence: No Work: No Toileting: No Drive: No Dressing: No Hobbies: No Electronic Signature(s) Signed: 06/28/2019 3:01:15 PM By: Carlene Coria RN Entered By: Carlene Coria on 05/13/2019 11:13:24 -------------------------------------------------------------------------------- Patient/Caregiver Education Details Patient Name: Mitchell Richard 10/23/2020andnbsp10:30 Date of Service: Mitchell Richard Medical Record [id number] Number: Patient Account Number: [id number] Treating RN: Date of Birth/Gender: 1961/03/21 (58 y.o. Mitchell Richard) Other Clinician: Primary Care Physician: Genevieve Norlander Referring Physician: Physician/Extender: Conception Oms in Treatment: 0 Education Assessment Education Provided To: Patient Education Topics Provided Pain: Handouts: A Guide to Pain Control Methods: Explain/Verbal, Printed Responses: Reinforcements needed Welcome To [facility]: Handouts: Welcome To [facility] Methods: Explain/Verbal,  Printed Responses: Reinforcements needed Electronic Signature(s) Signed: 05/16/2019 5:31:38 PM By: Deon Pilling Entered By: Deon Pilling on 05/13/2019 11:41:57 -------------------------------------------------------------------------------- Wound  Assessment Details Patient Name: Date of Service: Mitchell Richard, Mitchell Richard 05/13/2019 10:30 Mitchell Richard Medical Record Number:[id number] Patient Account Number: [id number] Date of Birth/Sex: Treating RN: 12/13/1960 (58 y.o. Mitchell Richard) [name] Primary Care Provider: Mee Hives Other Clinician: Referring Provider: Treating Provider/Extender:[name], Patrice Paradise, [name] Weeks in Treatment: 0 Wound Status Wound Number: 1 Primary Etiology: Inflammatory Wound Location: Richard Achilles Wound Status: Open Wounding Event: Trauma Comorbid History: Hypertension Date Acquired: 02/19/2019 Weeks Of Treatment: 0 Clustered Wound: No Photos Wound Measurements Length: (cm) 3.5 Width: (cm) 1.1 Depth: (cm) 2.5 Area: (cm) 3.024 Volume: (cm) 7.559 % Reduction in Area: 0% % Reduction in Volume: 0% Tunneling: No Undermining: Yes Starting Position (o'clock): 6 Ending Position (o'clock): 12 Maximum Distance: (cm) 2 Wound Description Full Thickness With Exposed Support Classification: Structures Exudate Small Amount: Exudate Type: Serosanguineous Exudate Color: red, brown Wound Bed Granulation Amount: Medium (34-66%) Necrotic Amount: Medium (34-66%) Necrotic Quality: Adherent Slough Foul Odor After Cleansing: No Slough/Fibrino Yes Exposed Structure Fascia Exposed: No Fat Layer (Subcutaneous Tissue) Exposed: Yes Tendon Exposed: Yes Muscle Exposed: No Joint Exposed: No Bone Exposed: Yes Electronic Signature(s) Signed: 05/17/2019 3:44:28 PM By: Benjaman Kindler EMT/HBOT Signed: 06/28/2019 3:01:15 PM By: [name] RN Entered By: Benjaman Kindler on 05/17/2019 10:21:53 -------------------------------------------------------------------------------- Vitals  Details Patient Name: Date of Service: Mitchell Richard, Mitchell Richard 05/13/2019 10:30 Mitchell Richard Medical Record [id number] Patient Account Number: [id number] Date of Birth/Sex: Treating RN: 1961-07-07 (58 y.o. Lina Sar, [place] Primary Care Provider: Mee Hives Other Clinician: Referring Provider: Treating Provider/Extender:[name], Patrice Paradise, [name] Weeks in Treatment: 0 Vital Signs Time Taken: 10:44 Temperature (F): 98.3 Height (in): 73 Pulse (bpm): 62 Source: Stated Respiratory Rate (breaths/min): 18 Weight (lbs): 230 Blood Pressure (mmHg): 151/96 Source: Stated Reference Range: 80 - 120 mg / dl Body Mass Index (BMI): 30.3 Electronic Signature(s) Signed: 06/28/2019 3:01:15 PM By: [name] RN Entered By: [name] on 05/13/2019 10:47:44

## 2019-06-28 NOTE — Progress Notes (Signed)
Mitchell Richard ([id number]) Visit Report for 05/13/2019 Abuse/Suicide Risk Screen Details Patient Name: Date of Service: Mitchell Richard, Mitchell Richard 05/13/2019 10:30 AM Medical Record Number:[id number] Patient Account Number: [id number] Date of Birth/Sex: Treating RN: 1961-01-22 (58 y.o. [name]) Mitchell Richard Primary Care Provider: Mee Hives Other Clinician: Referring Provider: Treating Provider/Extender:[name], Patrice Paradise, [name] Weeks in Treatment: 0 Abuse/Suicide Risk Screen Items Answer ABUSE RISK SCREEN: Has anyone close to you tried to hurt or harm you recentlyo No Do you feel uncomfortable with anyone in your familyo No Has anyone forced you do things that you didnt want to doo No Electronic Signature(s) Signed: 06/28/2019 3:01:15 PM By: Mitchell Pax RN Entered By: Mitchell Richard on 05/13/2019 10:52:17 -------------------------------------------------------------------------------- Activities of Daily Living Details Patient Name: Date of Service: Mitchell Richard 05/13/2019 10:30 AM Medical Record Number:[id number] Patient Account Number: [id number] Date of Birth/Sex: Treating RN: 01/11/1961 (58 y.o. [name]) Mitchell Richard Primary Care Provider: Mee Hives Other Clinician: Referring Provider: Treating Provider/Extender:[name], Patrice Paradise, [name] Weeks in Treatment: 0 Activities of Daily Living Items Answer Activities of Daily Living (Please select one for each item) Drive Automobile Completely Able Take Medications Completely Able Use Telephone Completely Able Care for Appearance Completely Able Use Toilet Completely Able Bath / Shower Completely Able Dress Self Completely Able Feed Self Completely Able Walk Completely Able Get In / Out Bed Completely Able Housework Completely Able Prepare Meals Completely Able Handle Money Completely Able Shop for Self Completely Able Electronic Signature(s) Signed: 06/28/2019 3:01:15 PM By: Mitchell Pax RN Entered By: Mitchell Richard on 05/13/2019  10:53:05 -------------------------------------------------------------------------------- Education Screening Details Patient Name: Date of Service: Mitchell Richard 05/13/2019 10:30 AM Medical Record [id number] Patient Account Number: [id number] Date of Birth/Sex: Treating RN: 04-30-1961 (58 y.o. [name] Primary Care Provider: Mee Hives Other Clinician: Referring Provider: Treating Provider/Extender:[name], Patrice Paradise, [name] Weeks in Treatment: 0 Primary Learner Assessed: Patient Learning Preferences/Education Level/Primary Language Learning Preference: Explanation Highest Education Level: High School Preferred Language: English Cognitive Barrier Language Barrier: No Translator Needed: No Memory Deficit: No Emotional Barrier: No Cultural/Religious Beliefs Affecting Medical Care: No Physical Barrier Impaired Vision: No Impaired Hearing: No Decreased Hand dexterity: No Knowledge/Comprehension Knowledge Level: Medium Comprehension Level: Medium Ability to understand written High instructions: Ability to understand verbal High instructions: Motivation Anxiety Level: Calm Cooperation: Cooperative Education Importance: Acknowledges Need Interest in Health Problems: Asks Questions Perception: Coherent Willingness to Engage in Self- High Management Activities: Readiness to Engage in Self- High Management Activities: Electronic Signature(s) Signed: 06/28/2019 3:01:15 PM By: Mitchell Pax RN Entered By: Mitchell Richard on 05/13/2019 10:53:36 -------------------------------------------------------------------------------- Fall Risk Assessment Details Patient Name: Date of Service: Mitchell Richard 05/13/2019 10:30 AM Medical Record [id number] Patient Account Number: [id number] Date of Birth/Sex: Treating RN: 04/23/1961 (58 y.o. [name]) Mitchell Richard Primary Care Provider: Mee Hives Other Clinician: Referring Provider: Treating Provider/Extender:[name],  Patrice Paradise, [name] Weeks in Treatment: 0 Fall Risk Assessment Items Have you had 2 or more falls in the last 12 monthso 0 Yes Have you had any fall that resulted in injury in the last 12 monthso 0 Yes FALLS RISK SCREEN History of falling - immediate or within 3 months 25 Yes Secondary diagnosis (Do you have 2 or more medical diagnoseso) 0 No Ambulatory aid None/bed rest/wheelchair/nurse 0 No Crutches/cane/walker 0 No Furniture 0 No Intravenous therapy Access/Saline/Heparin Lock 0 No Weak (short steps with or without shuffle, stooped but able to lift head 0 No while walking, may seek support from furniture) Impaired (short steps with shuffle, may have difficulty arising from chair, 0 No  head down, impaired balance) Mental Status Oriented to own ability 0 No Overestimates or forgets limitations 0 No Risk Level: Medium Risk Score: 25 Electronic Signature(s) Signed: 06/28/2019 3:01:15 PM By: Carlene Coria RN Entered By: Carlene Coria on 05/13/2019 10:54:10 -------------------------------------------------------------------------------- Foot Assessment Details Patient Name: Date of Service: Mitchell Richard 05/13/2019 10:30 AM Medical Record [id number] Patient Account Number: [id number] Date of Birth/Sex: Treating RN: [birth year] (58 y.o. [name]) Carlene Coria Primary Care Provider: Jack Quarto Other Clinician: Referring Provider: Treating Provider/Extender:[name], Wynn Maudlin, [name] Weeks in Treatment: 0 Foot Assessment Items Site Locations + = Sensation present, - = Sensation absent, C = Callus, U = Ulcer R = Redness, W = Warmth, M = Maceration, PU = Pre-ulcerative lesion F = Fissure, S = Swelling, D = Dryness Assessment Right: Left: Other Deformity: No No Prior Foot Ulcer: No No Prior Amputation: No No Charcot Joint: No No Ambulatory Status: Gait: Electronic Signature(s) Signed: 06/28/2019 3:01:15 PM By: Carlene Coria RN Entered By: Carlene Coria on 05/13/2019  11:04:46 -------------------------------------------------------------------------------- Nutrition Risk Screening Details Patient Name: Date of Service: Mitchell Richard 05/13/2019 10:30 AM Medical Record [id number] Patient Account Number: [id number] Date of Birth/Sex: Treating RN: 1960-10-11 (58 y.o. [name]) Carlene Coria Primary Care Provider: Jack Quarto Other Clinician: Referring Provider: Treating Provider/Extender:[name], Wynn Maudlin, [name] Weeks in Treatment: 0 Height (in): 73 Weight (lbs): 230 Body Mass Index (BMI): 30.3 Nutrition Risk Screening Items Score Screening NUTRITION RISK SCREEN: I have an illness or condition that made me change the kind and/or 0 No amount of food I eat I eat fewer than two meals per day 0 No I eat few fruits and vegetables, or milk products 0 No I have three or more drinks of beer, liquor or wine almost every day 0 No I have tooth or mouth problems that make it hard for me to eat 0 No I don't always have enough money to buy the food I need 0 No I eat alone most of the time 0 No I take three or more different prescribed or over-the-counter drugs a day 0 No 0 No Without wanting to, I have lost or gained 10 pounds in the last six months I am not always physically able to shop, cook and/or feed myself 2 Yes Nutrition Protocols Good Risk Protocol 0 No interventions needed Moderate Risk Protocol High Risk Proctocol Risk Level: Good Risk Score: 2 Electronic Signature(s) Signed: 06/28/2019 3:01:15 PM By: Carlene Coria RN Entered By: Carlene Coria on 05/13/2019 10:54:30

## 2019-07-04 ENCOUNTER — Telehealth: Payer: Self-pay | Admitting: *Deleted

## 2019-07-04 NOTE — Telephone Encounter (Signed)
   [facility] Pre-operative Risk Assessment    Request for surgical clearance:  1. What type of surgery is being performed? RIGHT FEMORAL ABOVE THE KNEE POPLITEAL BYPASS  2. When is this surgery scheduled? TBD  What type of clearance is required (medical clearance vs. Pharmacy clearance to hold med vs. Both)? CARDIAC 3. Are there any medications that need to be held prior to surgery and how long? NONE   4. Practice name and name of physician performing surgery? [facility]  DR CHRISTOPHER DICKSON  5. What is your office phone number [phone]    7.   What is your office fax number [phone]  8.   Anesthesia type (None, local, MAC, general) ?  CHOICE   Devra Dopp 07/04/2019, 4:11 PM  _________________________________________________________________   (provider comments below)

## 2019-07-04 NOTE — Telephone Encounter (Signed)
   Primary Cardiologist:[place] Doreatha Lew, MD  Chart reviewed as part of pre-operative protocol coverage. This patient has been undergoing preop testing with Dr. Audie Box, tentatively planned for cardiac MRI 07/19/2019. Will readdress preop status after this testing has occurred.   Pre-op covering staff: - Please contact requesting surgeon's office via preferred method (i.e, phone, fax) to inform them of pending testing. We will readdress preop status after 07/19/2019.   Abigail Butts, PA-C  07/04/2019, 4:37 PM

## 2019-07-05 ENCOUNTER — Telehealth: Payer: Self-pay | Admitting: Cardiovascular Disease

## 2019-07-05 NOTE — Telephone Encounter (Signed)
Follow up    Pt is calling and is wanting to speak with the nurse or Dr Marisue Ivan. He says he is needing to be cleared for surgery and is wanting to get it done soon, he is worried he will loose his leg    Please call

## 2019-07-05 NOTE — Telephone Encounter (Signed)
Called Mr. Mera. Explained we need the cardiac MR and then we can send him safely to surgery. He was in agreement.   [place] T. Audie Box, [place]  [address] [place], [place] [address] [phone]  3:39 PM

## 2019-07-05 NOTE — Telephone Encounter (Signed)
I s/w the pt and in regards to being cleared for his surgery.I explained to the pt that he has an MRI being done on 07/19/19. Per Pre OP Provider will wait for MRI to see if pt can be cleared for surgery. Pt also c/o white film on his tongue. I advised him to call his PCP. Pt states he also has been not feeling well since he was started on antibiotics.  I advised pt he will need to f/u with PCP. Pt thanked me for the call.

## 2019-07-05 NOTE — Telephone Encounter (Signed)
I s/w Mitchell Richard this morning at [facility]. Informed to please let Dr. Scot Dock know pt has MRI 07/19/19 and clearance will be decided at that time once MRI has been reviewed. Alyse Low thanked me for the update and let Dr. Scot Dock know. I did also route the notes to Dr. Scot Dock as well as Juluis Rainier.

## 2019-07-05 NOTE — Telephone Encounter (Signed)
Called Dr. Nicole Cella office 8:15 am though recording states office opens at 9 am . I will call back later to inform of clearance to be assessed after MRI.

## 2019-07-07 ENCOUNTER — Encounter (HOSPITAL_COMMUNITY): Payer: Self-pay

## 2019-07-07 ENCOUNTER — Ambulatory Visit (HOSPITAL_COMMUNITY)
Admission: EM | Admit: 2019-07-07 | Discharge: 2019-07-07 | Disposition: A | Discharge status: Home / Self Care | Payer: Medicaid Other | Attending: Family Medicine | Admitting: Family Medicine

## 2019-07-07 ENCOUNTER — Ambulatory Visit (INDEPENDENT_AMBULATORY_CARE_PROVIDER_SITE_OTHER): Payer: Medicaid Other

## 2019-07-07 ENCOUNTER — Other Ambulatory Visit: Payer: Self-pay

## 2019-07-07 DIAGNOSIS — S91301A Unspecified open wound, right foot, initial encounter: Secondary | ICD-10-CM

## 2019-07-07 DIAGNOSIS — R0981 Nasal congestion: Secondary | ICD-10-CM

## 2019-07-07 DIAGNOSIS — Z20828 Contact with and (suspected) exposure to other viral communicable diseases: Secondary | ICD-10-CM | POA: Insufficient documentation

## 2019-07-07 DIAGNOSIS — L97414 Non-pressure chronic ulcer of right heel and midfoot with necrosis of bone: Secondary | ICD-10-CM | POA: Insufficient documentation

## 2019-07-07 DIAGNOSIS — I10 Essential (primary) hypertension: Secondary | ICD-10-CM | POA: Diagnosis not present

## 2019-07-07 DIAGNOSIS — Z79899 Other long term (current) drug therapy: Secondary | ICD-10-CM | POA: Diagnosis not present

## 2019-07-07 DIAGNOSIS — R432 Parageusia: Secondary | ICD-10-CM

## 2019-07-07 DIAGNOSIS — R1084 Generalized abdominal pain: Secondary | ICD-10-CM | POA: Diagnosis not present

## 2019-07-07 DIAGNOSIS — R110 Nausea: Secondary | ICD-10-CM | POA: Diagnosis not present

## 2019-07-07 DIAGNOSIS — I739 Peripheral vascular disease, unspecified: Secondary | ICD-10-CM | POA: Insufficient documentation

## 2019-07-07 DIAGNOSIS — F17210 Nicotine dependence, cigarettes, uncomplicated: Secondary | ICD-10-CM | POA: Diagnosis not present

## 2019-07-07 DIAGNOSIS — R630 Anorexia: Secondary | ICD-10-CM | POA: Insufficient documentation

## 2019-07-07 DIAGNOSIS — R5383 Other fatigue: Secondary | ICD-10-CM | POA: Insufficient documentation

## 2019-07-07 DIAGNOSIS — K5900 Constipation, unspecified: Secondary | ICD-10-CM | POA: Insufficient documentation

## 2019-07-07 DIAGNOSIS — Z8249 Family history of ischemic heart disease and other diseases of the circulatory system: Secondary | ICD-10-CM | POA: Diagnosis not present

## 2019-07-07 DIAGNOSIS — R519 Headache, unspecified: Secondary | ICD-10-CM | POA: Insufficient documentation

## 2019-07-07 DIAGNOSIS — R439 Unspecified disturbances of smell and taste: Secondary | ICD-10-CM | POA: Diagnosis not present

## 2019-07-07 NOTE — Discharge Instructions (Addendum)
We cleaned and re wrapped your foot today here in clinic.  Keep taking your antibiotics that were prescribed.  You did  not appear to be constipated on your xray.  You can continue the medication OTC for symptoms  We will call you of your COVID test is positive. This could be the cause of a lot of your symptoms since you were exposed.  Follow up with your doctor next week as planned.

## 2019-07-07 NOTE — ED Triage Notes (Signed)
Pt presents to the UC with abdominal pain, loss of smell, loss of taste, fatigue, headache, body aches and nausea x 2 weeks.
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
## 2019-07-08 LAB — NOVEL CORONAVIRUS, NAA (HOSP ORDER, SEND-OUT TO REF LAB; TAT 18-24 HRS): SARS-CoV-2, NAA: NOT DETECTED

## 2019-07-13 ENCOUNTER — Telehealth: Payer: Self-pay | Admitting: Emergency Medicine

## 2019-07-13 NOTE — Telephone Encounter (Signed)
Patient contacted by phone and made aware of    Negative covid results. Pt verbalized understanding and had all questions answered.  

## 2019-07-14 ENCOUNTER — Encounter (HOSPITAL_BASED_OUTPATIENT_CLINIC_OR_DEPARTMENT_OTHER): Payer: Medicaid Other | Admitting: Internal Medicine

## 2019-07-18 ENCOUNTER — Telehealth (HOSPITAL_COMMUNITY): Payer: Self-pay | Admitting: Emergency Medicine

## 2019-07-18 NOTE — Telephone Encounter (Signed)
Reaching out to patient to offer assistance regarding upcoming cardiac imaging study; pt verbalizes understanding of appt date/time, parking situation and where to check in, and verified current allergies; name and call back number provided for further questions should they arise Marchia Bond RN Navigator Cardiac Imaging [facility] [phone] office [phone] cell  Denies implants, denies claustrophobia

## 2019-07-19 ENCOUNTER — Ambulatory Visit (HOSPITAL_COMMUNITY): Admission: RE | Admit: 2019-07-19 | Payer: Medicaid Other | Source: Ambulatory Visit

## 2019-07-21 NOTE — Telephone Encounter (Signed)
Patient was a no-show for his cardiac MRI. Per conversation with Marchia Bond, RN - Dr. Audie Box plans to call the patient. Will await telephone note.   Loel Dubonnet, NP

## 2019-08-01 NOTE — Telephone Encounter (Signed)
Patient no-showed original cardiac MRI that was scheduled for 12/292020. Looks like it has now been re-scheduled for 08/10/2019. Can you please notify Dr. Adele Dan office of this and that we will readdress pre-op status after this testing has occurred?  Thank you!

## 2019-08-01 NOTE — Telephone Encounter (Signed)
I have sent a fax of recommendations to requesting surgeon's office.   

## 2019-08-03 ENCOUNTER — Other Ambulatory Visit: Payer: Self-pay

## 2019-08-09 ENCOUNTER — Telehealth (HOSPITAL_COMMUNITY): Payer: Self-pay | Admitting: Emergency Medicine

## 2019-08-09 NOTE — Telephone Encounter (Signed)
Left message on voicemail with name and callback number Sara Wallace RN Navigator Cardiac Imaging [facility] [phone] Office [phone] Cell  

## 2019-08-09 NOTE — Telephone Encounter (Signed)
Called patient back to inform him that Dr. Scharlene Gloss DOES want patient to have cMR tomorrow.  Pt verbalized understanding. No further questions at this time.  Pt appreciated the call  Rockwell Alexandria RN Navigator Cardiac Imaging [facility] [phone] Office  [phone] Cell

## 2019-08-09 NOTE — Telephone Encounter (Signed)
Pt returning phone call regarding cMR scheduled for tomorrow.   Patient asking what the appt is for, I explained that he needed cardiology clearance for surgery.   Pt states hes already been cleared and that the surgery is posted for 08/16/19.   Please advise if we still need the cMR as scheduled tomorrow.  Rockwell Alexandria RN Navigator Cardiac Imaging [facility] [phone] Office  [phone] Cell

## 2019-08-09 NOTE — Telephone Encounter (Signed)
Yes. He still needs. I have no cleared him. Please make sure he knows to come. -W

## 2019-08-10 ENCOUNTER — Ambulatory Visit (HOSPITAL_COMMUNITY)
Admission: RE | Admit: 2019-08-10 | Discharge: 2019-08-10 | Disposition: A | Discharge status: Home / Self Care | Payer: Medicaid Other | Source: Ambulatory Visit | Attending: Cardiovascular Disease | Admitting: Cardiovascular Disease

## 2019-08-10 ENCOUNTER — Other Ambulatory Visit: Payer: Self-pay

## 2019-08-10 DIAGNOSIS — I428 Other cardiomyopathies: Secondary | ICD-10-CM

## 2019-08-10 LAB — CREATININE, SERUM
Creatinine, Ser: 1.09 mg/dL (ref 0.61–1.24)
GFR calc Af Amer: >60 mL/min (ref >60)
GFR calc non Af Amer: >60 mL/min (ref >60)

## 2019-08-10 MED ORDER — GADOBUTROL 1 MMOL/ML IV SOLN
9.0000 mL | Freq: Once | INTRAVENOUS | Status: AC | PRN
  Administered 2019-08-10: 9 mL via INTRAVENOUS

## 2019-08-10 NOTE — Telephone Encounter (Signed)
Dr. Flora Lipps, this pt needs Fem pop bypass. He had cardiac MRI today. Can you review and comment on his clearance.   Please route response back to P CV DIV PREOP  Thanks, Coralee North

## 2019-08-10 NOTE — Telephone Encounter (Signed)
I will take it from here. Not sure why he ended up in your box. -W

## 2019-08-10 NOTE — Telephone Encounter (Signed)
Pt had MRI today. I will check with the pre op team to see if the pt is cleared for his procedure.

## 2019-08-11 NOTE — Telephone Encounter (Signed)
Thank you. This was a request for clearance in the preop pool.  If you can address clearance when you have completed your evaluation that would be great.

## 2019-08-11 NOTE — Telephone Encounter (Signed)
We need to get Mitchell Richard in tomorrow to see me for preop clearance. Can we get him overbooked mid morning?  -W

## 2019-08-11 NOTE — Telephone Encounter (Signed)
I called pt again and advised let's schedule him for 08/12/19 @ 2:20 pm with Dr. Scharlene Gloss to discuss MRI for pre op clearance. See earlier note from today. I will remove from the pre op call back pool and forward notes to Dr. Scharlene Gloss for upcoming appt. Pt also did not realize that he was set up for Admissions pre op work up @ 11 am.  Pt states all Dr. Edilia Bo told him was to be at [facility] 08/12/19 at 12:30 for his covid test. I advised the pt to call Dr. Edilia Bo office for further information in regards to the 11 am Admissions pre op testing. Pt thanked me for the call.

## 2019-08-11 NOTE — Progress Notes (Signed)
[id number] [address], [address] [place] [address] Phone: [phone] Fax: [phone]  [place] [address] [place] [address] Phone: [phone] Fax: [address], [place] [address] Phone: [phone] Fax: [phone]      Your procedure is scheduled on Tuesday, August 16, 2019.  Report to [facility] Main Entrance "A" at 5:30 A.M., and check in at the Admitting office.  Call this number if you have problems the morning of surgery:  [phone]  Call [phone] if you have any questions prior to your surgery date Monday-Friday 8am-4pm    Remember:  Do not eat or drink after midnight the night before your surgery     Take these medicines the morning of surgery with A SIP OF WATER: NONE  7 days prior to surgery STOP taking any Aspirin (unless otherwise instructed by your surgeon), Aleve, Naproxen, Ibuprofen, Motrin, Advil, Goody's, BC's, all herbal medications, fish oil, and all vitamins.    The Morning of Surgery  Do not wear jewelry.  Do not wear lotions, powders, colognes, or deodorant  Men may shave face and neck.  Do not bring valuables to the hospital.  [facility] is not responsible for any belongings or valuables.  If you are a smoker, DO NOT Smoke 24 hours prior to surgery  If you wear a CPAP at night please bring your mask the morning of surgery   Remember that you must have someone to transport you home after your surgery, and remain with you for 24 hours if you are discharged the same day.   Please bring cases for contacts, glasses, hearing aids, dentures or bridgework because it cannot be worn into surgery.    Leave your suitcase in the car.  After  surgery it may be brought to your room.  For patients admitted to the hospital, discharge time will be determined by your treatment team.  Patients discharged the day of surgery will not be allowed to drive home.    Special instructions:   [place]- Preparing For Surgery  Before surgery, you can play an important role. Because skin is not sterile, your skin needs to be as free of germs as possible. You can reduce the number of germs on your skin by washing with CHG (chlorahexidine gluconate) Soap before surgery.  CHG is an antiseptic cleaner which kills germs and bonds with the skin to continue killing germs even after washing.    Oral Hygiene is also important to reduce your risk of infection.  Remember - BRUSH YOUR TEETH THE MORNING OF SURGERY WITH YOUR REGULAR TOOTHPASTE  Please do not use if you have an allergy to CHG or antibacterial soaps. If your skin becomes reddened/irritated stop using the CHG.  Do not shave (including legs and underarms) for at least 48 hours prior to first CHG shower. It is OK to shave your face.  Please follow these instructions carefully.   1. Shower the NIGHT BEFORE SURGERY and the MORNING OF SURGERY with CHG Soap.   2. If you chose to wash your hair, wash your hair first as usual with your normal shampoo.  3. After you shampoo, rinse your hair and body thoroughly to remove the  shampoo.  4. Use CHG as you would any other liquid soap. You can apply CHG directly to the skin and wash gently with a scrungie or a clean washcloth.   5. Apply the CHG Soap to your body ONLY FROM THE NECK DOWN.  Do not use on open wounds or open sores. Avoid contact with your eyes, ears, mouth and genitals (private parts). Wash Face and genitals (private parts)  with your normal soap.   6. Wash thoroughly, paying special attention to the area where your surgery will be performed.  7. Thoroughly rinse your body with warm water from the neck down.  8. DO NOT shower/wash with  your normal soap after using and rinsing off the CHG Soap.  9. Pat yourself dry with a CLEAN TOWEL.  10. Wear CLEAN PAJAMAS to bed the night before surgery, wear comfortable clothes the morning of surgery  11. Place CLEAN SHEETS on your bed the night of your first shower and DO NOT SLEEP WITH PETS.    Day of Surgery:  Please shower the morning of surgery with the CHG soap Do not apply any deodorants/lotions. Please wear clean clothes to the hospital/surgery center.   Remember to brush your teeth WITH YOUR REGULAR TOOTHPASTE.   Please read over the following fact sheets that you were given.

## 2019-08-11 NOTE — Telephone Encounter (Signed)
I s/w pt today per Dr. Flora Lipps to double book him tomorrow to see the pt to discuss MRI further for pre op clearance. Pt is unable to do 8:20 appt and opted for the 11:40. I went to schedule the pt for 11:40 time slot and pt is scheduled for pre op work up at 11 am and then covid test at 12:25 PM. I have sent a message to Dr. Flora Lipps asking if ok to double book him in the PM. I am awaiting reply from Dr. Flora Lipps.

## 2019-08-11 NOTE — Telephone Encounter (Signed)
Noted. Thank you! Dr.O'Neal was notified of patients upcoming appointment, any changes I will contact and let him know.

## 2019-08-12 ENCOUNTER — Telehealth: Payer: Self-pay | Admitting: Cardiovascular Disease

## 2019-08-12 ENCOUNTER — Other Ambulatory Visit: Payer: Self-pay

## 2019-08-12 ENCOUNTER — Inpatient Hospital Stay (HOSPITAL_COMMUNITY)
Admission: RE | Admit: 2019-08-12 | Discharge: 2019-08-12 | Disposition: A | Discharge status: Home / Self Care | Payer: Medicaid Other | Source: Ambulatory Visit

## 2019-08-12 ENCOUNTER — Encounter (HOSPITAL_COMMUNITY): Payer: Self-pay | Admitting: Vascular Surgery

## 2019-08-12 ENCOUNTER — Ambulatory Visit: Payer: Medicaid Other | Admitting: Cardiovascular Disease

## 2019-08-12 ENCOUNTER — Inpatient Hospital Stay (HOSPITAL_COMMUNITY): Admission: RE | Admit: 2019-08-12 | Payer: Medicaid Other | Source: Ambulatory Visit
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
## 2019-08-12 NOTE — Progress Notes (Signed)
Anesthesia Chart Review:  Cardiac clearance per note by Dr. Flora Lipps 08/12/19, "Discussed recent results with Mitchell Richard. He has LV noncompaction. He does have evidence of a prior myocardial infarction. He is ok to proceed with surgery. His LV dysfunction is subclinical. He will need to be on anticoagulation but I will wait to start this after his upcoming vascular surgery. I have plans to see him on 2/10. I will route this to Dr. Waverly Ferrari to get an idea of when I can start [facility] postoperatively. We will optimize his regimen after I see him, but this does not need to delay his symptoms."  Pt was no-show for PAT appointment.History of polysubstance abuse including recent use of cocaine. Dr. Edilia Bo notified by PAT nurse.  Will need DOS labs and eval.  EKG 05/30/19: NSR. Nonspecific T wave abnormality.Rate 69.  Cardiac MRI 08/10/19: IMPRESSION: 1. Normal LV size with EF 43%, global mild hypokinesis with basal inferior severe hypokinesis. Prominent trabeculation pattern fitting criteria for LV noncompaction.  2.  Normal RV size and systolic function, EF 48%.  3. On delayed enhancement imaging, there was coronary-pattern LGE in the basal inferior wall.  Suspect prior inferior MI (minimal viability given >75% wall thickness subendocardial LGE) co-existing with LV noncompaction.  TTE 06/07/19:  1. Left ventricular ejection fraction, by visual estimation, is 35 to 40%. The left ventricle has moderately decreased function. There is mildly increased left ventricular hypertrophy. Inferior akinesis.  2. Global right ventricle has normal systolic function.The right ventricular size is normal. No increase in right ventricular wall thickness.  3. Left atrial size was normal.  4. Right atrial size was normal.  5. The mitral valve is normal in structure. No evidence of mitral valve regurgitation.  6. The tricuspid valve is normal in structure. Tricuspid valve regurgitation is trivial.  7. The  aortic valve is tricuspid. Aortic valve regurgitation is not visualized. No evidence of aortic valve sclerosis or stenosis.  8. The pulmonic valve was not well visualized. Pulmonic valve regurgitation is not visualized.  9. The inferior vena cava is normal in size with greater than 50% respiratory variability, suggesting right atrial pressure of 3 mmHg. 10. TR signal is inadequate for assessing pulmonary artery systolic pressure.  Nuclear stress 06/01/19:  There was no ST segment deviation noted during stress.  The left ventricular ejection fraction is moderately decreased (30-44%).  Nuclear stress EF: 35%.  Defect 1: There is a medium defect of moderate severity present in the basal inferoseptal, basal inferior, mid inferoseptal and mid inferior location.  Findings consistent with prior myocardial infarction.  This is an intermediate risk study due to significant LV systolic dysfunction. No ischemia appreciated

## 2019-08-12 NOTE — Telephone Encounter (Signed)
Discussed recent results with Mitchell Richard. He has LV noncompaction. He does have evidence of a prior myocardial infarction. He is ok to proceed with surgery. His LV dysfunction is subclinical. He will need to be on anticoagulation but I will wait to start this after his upcoming vascular surgery. I have plans to see him on 2/10. I will route this to Dr. Waverly Ferrari to get an idea of when I can start [facility] postoperatively. We will optimize his regimen after I see him, but this does not need to delay his symptoms.   Gerri Spore T. Flora Lipps, MD [facility]  [address] [place], [place] [address] [phone]  3:10 PM

## 2019-08-12 NOTE — Telephone Encounter (Signed)
Patient is calling because he had to cancel appointment scheduled for 08/12/19 at 2:20 PM due to lack of transportation. Patient states because appointment has been cancelled he would like to discuss MRI for pre op on the phone. Please call.

## 2019-08-12 NOTE — Telephone Encounter (Signed)
Pt calling to let Dr. Flora Lipps know he had to change his appointment until after his upcoming surgery. He had transportation limitations. He thought Dr. Flora Lipps wanted to discuss his MRI results with him. I advised him if so, he may could give him a call if urgent.  He has verbalized understanding and had no additional questions.

## 2019-08-12 NOTE — Progress Notes (Signed)
Called Dr. Adele Dan office and phone just rang.  Sent message to Dr. Edilia Bo letting him know that patient has used Cocaine and Marijuana as early as last week.

## 2019-08-15 NOTE — Telephone Encounter (Signed)
   Primary Cardiologist:Lebec Cleophus Molt, MD  Chart reviewed as part of pre-operative protocol coverage. Pre-op clearance already addressed by my colleagues in prior phone notes and Dr. Flora Lipps per phone notes and results of cardiac MRI. To summarize recommendations:  -Per Dr. Flora Lipps: "He has LV noncompaction. He does have evidence of a prior myocardial infarction. He is ok to proceed with surgery. His LV dysfunction is subclinical. He will need to be on anticoagulation but I will wait to start this after his upcoming vascular surgery. I have plans to see him on 2/10. I will route this to Dr. Waverly Ferrari to get an idea of when I can start [facility] postoperatively. We will optimize his regimen after I see him, but this does not need to delay his symptoms." He may proceed with surgery.   Will route this bundled recommendation to requesting provider via Epic fax function. Please call with questions.  Berton Bon, NP 08/15/2019, 8:28 AM

## 2019-08-15 NOTE — Anesthesia Preprocedure Evaluation (Addendum)
Anesthesia Evaluation  Patient identified by MRN, date of birth, ID band Patient awake    Reviewed: Allergy & Precautions, NPO status , Patient's Chart, lab work & pertinent test results  History of Anesthesia Complications Negative for: history of anesthetic complications  Airway Mallampati: II  TM Distance: >3 FB Neck ROM: Full    Dental  (+) Dental Advisory Given   Pulmonary Recent URI , Current Smoker and Patient abstained from smoking.,    breath sounds clear to auscultation       Cardiovascular hypertension, Pt. on medications + Peripheral Vascular Disease   Rhythm:Regular   1. Left ventricular ejection fraction, by visual estimation, is 35 to 40%. The left ventricle has moderately decreased function. There is mildly increased left ventricular hypertrophy. Inferior akinesis.  2. Global right ventricle has normal systolic function.The right ventricular size is normal. No increase in right ventricular wall thickness.  3. Left atrial size was normal.  4. Right atrial size was normal.  5. The mitral valve is normal in structure. No evidence of mitral valve regurgitation.  6. The tricuspid valve is normal in structure. Tricuspid valve regurgitation is trivial.  7. The aortic valve is tricuspid. Aortic valve regurgitation is not visualized. No evidence of aortic valve sclerosis or stenosis.  8. The pulmonic valve was not well visualized. Pulmonic valve regurgitation is not visualized.  9. The inferior vena cava is normal in size with greater than 50% respiratory variability, suggesting right atrial pressure of 3 mmHg. 10. TR signal is inadequate for assessing pulmonary artery systolic pressure.   Neuro/Psych PSYCHIATRIC DISORDERS Anxiety negative neurological ROS     GI/Hepatic negative GI ROS, (+)     substance abuse  cocaine use,   Endo/Other  negative endocrine ROS  Renal/GU negative Renal ROS      Musculoskeletal negative musculoskeletal ROS (+)   Abdominal   Peds  Hematology negative hematology ROS (+)   Anesthesia Other Findings   Reproductive/Obstetrics                           Anesthesia Physical Anesthesia Plan  ASA: IV  Anesthesia Plan: General   Post-op Pain Management:    Induction: Intravenous  PONV Risk Score and Plan: 1 and Ondansetron and Dexamethasone  Airway Management Planned: Oral ETT  Additional Equipment: Arterial line  Intra-op Plan:   Post-operative Plan: Extubation in OR  Informed Consent: I have reviewed the patients History and Physical, chart, labs and discussed the procedure including the risks, benefits and alternatives for the proposed anesthesia with the patient or authorized representative who has indicated his/her understanding and acceptance.     Dental advisory given  Plan Discussed with: CRNA and Surgeon  Anesthesia Plan Comments: (Cardiac clearance per note by Dr. Audie Box 08/12/19, "Discussed recent results with Mitchell Richard. He has LV noncompaction. He does have evidence of a prior myocardial infarction. He is ok to proceed with surgery. His LV dysfunction is subclinical. He will need to be on anticoagulation but I will wait to start this after his upcoming vascular surgery. I have plans to see him on 2/10. I will route this to Dr. Deitra Mayo to get an idea of when I can start [facility] postoperatively. We will optimize his regimen after I see him, but this does not need to delay his symptoms."  Pt was no-show for PAT appointment.History of polysubstance abuse including recent use of cocaine. Dr. Scot Dock notified by PAT nurse.  Will need DOS  labs and eval.  EKG 05/30/19: NSR. Nonspecific T wave abnormality.Rate 69.  Cardiac MRI 08/10/19: IMPRESSION: 1. Normal LV size with EF 43%, global mild hypokinesis with basal inferior severe hypokinesis. Prominent trabeculation pattern fitting criteria for LV  noncompaction.  2.  Normal RV size and systolic function, EF 48%.  3. On delayed enhancement imaging, there was coronary-pattern LGE in the basal inferior wall.  Suspect prior inferior MI (minimal viability given >75% wall thickness subendocardial LGE) co-existing with LV noncompaction.  TTE 06/07/19:  1. Left ventricular ejection fraction, by visual estimation, is 35 to 40%. The left ventricle has moderately decreased function. There is mildly increased left ventricular hypertrophy. Inferior akinesis.  2. Global right ventricle has normal systolic function.The right ventricular size is normal. No increase in right ventricular wall thickness.  3. Left atrial size was normal.  4. Right atrial size was normal.  5. The mitral valve is normal in structure. No evidence of mitral valve regurgitation.  6. The tricuspid valve is normal in structure. Tricuspid valve regurgitation is trivial.  7. The aortic valve is tricuspid. Aortic valve regurgitation is not visualized. No evidence of aortic valve sclerosis or stenosis.  8. The pulmonic valve was not well visualized. Pulmonic valve regurgitation is not visualized.  9. The inferior vena cava is normal in size with greater than 50% respiratory variability, suggesting right atrial pressure of 3 mmHg. 10. TR signal is inadequate for assessing pulmonary artery systolic pressure.  Nuclear stress 06/01/19: There was no ST segment deviation noted during stress. The left ventricular ejection fraction is moderately decreased (30-44%). Nuclear stress EF: 35%. Defect 1: There is a medium defect of moderate severity present in the basal inferoseptal, basal inferior, mid inferoseptal and mid inferior location. Findings consistent with prior myocardial infarction. This is an intermediate risk study due to significant LV systolic dysfunction. No ischemia appreciated)       Anesthesia Quick Evaluation

## 2019-08-16 ENCOUNTER — Encounter (HOSPITAL_COMMUNITY)
Admission: RE | Disposition: A | Discharge status: Home / Self Care | Payer: Self-pay | Source: Home / Self Care | Attending: Vascular Surgery

## 2019-08-16 ENCOUNTER — Encounter (HOSPITAL_COMMUNITY): Payer: Self-pay | Admitting: Vascular Surgery

## 2019-08-16 ENCOUNTER — Inpatient Hospital Stay (HOSPITAL_COMMUNITY)
Admission: RE | Admit: 2019-08-16 | Discharge: 2019-08-21 | DRG: 252 | Disposition: A | Discharge status: Home / Self Care | Payer: Medicaid Other | Attending: Vascular Surgery | Admitting: Vascular Surgery

## 2019-08-16 ENCOUNTER — Inpatient Hospital Stay (HOSPITAL_COMMUNITY): Payer: Medicaid Other

## 2019-08-16 ENCOUNTER — Inpatient Hospital Stay (HOSPITAL_COMMUNITY): Payer: Medicaid Other | Admitting: Anesthesiology

## 2019-08-16 ENCOUNTER — Inpatient Hospital Stay (HOSPITAL_COMMUNITY): Payer: Medicaid Other | Admitting: Physician Assistant

## 2019-08-16 ENCOUNTER — Other Ambulatory Visit: Payer: Self-pay

## 2019-08-16 DIAGNOSIS — F4310 Post-traumatic stress disorder, unspecified: Secondary | ICD-10-CM | POA: Diagnosis present

## 2019-08-16 DIAGNOSIS — F17210 Nicotine dependence, cigarettes, uncomplicated: Secondary | ICD-10-CM | POA: Diagnosis present

## 2019-08-16 DIAGNOSIS — Z23 Encounter for immunization: Secondary | ICD-10-CM

## 2019-08-16 DIAGNOSIS — Z8249 Family history of ischemic heart disease and other diseases of the circulatory system: Secondary | ICD-10-CM | POA: Diagnosis not present

## 2019-08-16 DIAGNOSIS — I739 Peripheral vascular disease, unspecified: Secondary | ICD-10-CM | POA: Diagnosis present

## 2019-08-16 DIAGNOSIS — I998 Other disorder of circulatory system: Secondary | ICD-10-CM | POA: Diagnosis present

## 2019-08-16 DIAGNOSIS — I10 Essential (primary) hypertension: Secondary | ICD-10-CM | POA: Diagnosis present

## 2019-08-16 DIAGNOSIS — Z419 Encounter for procedure for purposes other than remedying health state, unspecified: Secondary | ICD-10-CM

## 2019-08-16 DIAGNOSIS — I70229 Atherosclerosis of native arteries of extremities with rest pain, unspecified extremity: Secondary | ICD-10-CM | POA: Diagnosis present

## 2019-08-16 DIAGNOSIS — U071 COVID-19: Secondary | ICD-10-CM | POA: Diagnosis present

## 2019-08-16 HISTORY — PX: INTRAOPERATIVE ARTERIOGRAM: SHX5157

## 2019-08-16 HISTORY — PX: FEMORAL-POPLITEAL BYPASS GRAFT: SHX937

## 2019-08-16 LAB — URINALYSIS, ROUTINE W REFLEX MICROSCOPIC
Bilirubin Urine: NEGATIVE
Glucose, UA: NEGATIVE mg/dL
Hgb urine dipstick: NEGATIVE
Ketones, ur: NEGATIVE mg/dL
Leukocytes,Ua: NEGATIVE
Nitrite: NEGATIVE
Protein, ur: NEGATIVE mg/dL
Specific Gravity, Urine: 1.009 (ref 1.005–1.030)
pH: 6 (ref 5.0–8.0)

## 2019-08-16 LAB — COMPREHENSIVE METABOLIC PANEL
ALT: 12 U/L (ref 0–44)
AST: 13 U/L — ABNORMAL LOW (ref 15–41)
Albumin: 3.3 g/dL — ABNORMAL LOW (ref 3.5–5.0)
Alkaline Phosphatase: 84 U/L (ref 38–126)
Anion gap: 10 (ref 5–15)
BUN: 9 mg/dL (ref 8–23)
CO2: 24 mmol/L (ref 22–32)
Calcium: 9 mg/dL (ref 8.9–10.3)
Chloride: 109 mmol/L (ref 98–111)
Creatinine, Ser: 1.16 mg/dL (ref 0.61–1.24)
GFR calc Af Amer: >60 mL/min (ref >60)
GFR calc non Af Amer: >60 mL/min (ref >60)
Glucose, Bld: 79 mg/dL (ref 70–99)
Potassium: 3.1 mmol/L — ABNORMAL LOW (ref 3.5–5.1)
Sodium: 143 mmol/L (ref 135–145)
Total Bilirubin: 0.9 mg/dL (ref 0.3–1.2)
Total Protein: 6.3 g/dL — ABNORMAL LOW (ref 6.5–8.1)

## 2019-08-16 LAB — TYPE AND SCREEN
ABO/RH(D): O POS
Antibody Screen: NEGATIVE

## 2019-08-16 LAB — CBC
HCT: 41.6 % (ref 39.0–52.0)
Hemoglobin: 13.9 g/dL (ref 13.0–17.0)
MCH: 29.2 pg (ref 26.0–34.0)
MCHC: 33.4 g/dL (ref 30.0–36.0)
MCV: 87.4 fL (ref 80.0–100.0)
Platelets: 278 10*3/uL (ref 150–400)
RBC: 4.76 MIL/uL (ref 4.22–5.81)
RDW: 15.1 % (ref 11.5–15.5)
WBC: 7.1 10*3/uL (ref 4.0–10.5)
nRBC: 0 % (ref 0.0–0.2)

## 2019-08-16 LAB — APTT: aPTT: 33 seconds (ref 24–36)

## 2019-08-16 LAB — RESPIRATORY PANEL BY RT PCR (FLU A&B, COVID)
Influenza A by PCR: NEGATIVE
Influenza B by PCR: NEGATIVE
SARS Coronavirus 2 by RT PCR: POSITIVE — AB

## 2019-08-16 LAB — PROTIME-INR
INR: 1 (ref 0.8–1.2)
Prothrombin Time: 13 seconds (ref 11.4–15.2)

## 2019-08-16 LAB — ABO/RH: ABO/RH(D): O POS

## 2019-08-16 SURGERY — BYPASS GRAFT FEMORAL-POPLITEAL ARTERY
Anesthesia: General | Site: Leg Upper | Laterality: Right

## 2019-08-16 MED ORDER — ACETAMINOPHEN 325 MG PO TABS
325.0000 mg | ORAL_TABLET | ORAL | Status: DC | PRN
  Administered 2019-08-18: 650 mg via ORAL
  Filled 2019-08-16: qty 2

## 2019-08-16 MED ORDER — ONDANSETRON HCL 4 MG/2ML IJ SOLN: 4.0000 mg | Freq: Four times a day (QID) | INTRAMUSCULAR | Status: DC | PRN

## 2019-08-16 MED ORDER — LACTATED RINGERS IV SOLN: INTRAVENOUS | Status: DC

## 2019-08-16 MED ORDER — EPHEDRINE 5 MG/ML INJ
INTRAVENOUS | Status: AC
  Filled 2019-08-16: qty 10

## 2019-08-16 MED ORDER — SODIUM CHLORIDE 0.9 % IV SOLN: INTRAVENOUS | Status: DC

## 2019-08-16 MED ORDER — LIDOCAINE 2% (20 MG/ML) 5 ML SYRINGE
INTRAMUSCULAR | Status: AC
  Filled 2019-08-16: qty 5

## 2019-08-16 MED ORDER — OXYCODONE-ACETAMINOPHEN 5-325 MG PO TABS
1.0000 | ORAL_TABLET | ORAL | Status: DC | PRN
  Administered 2019-08-17 – 2019-08-21 (×16): 2 via ORAL
  Filled 2019-08-16 (×7): qty 2
  Filled 2019-08-16 (×2): qty 1
  Filled 2019-08-16 (×9): qty 2

## 2019-08-16 MED ORDER — ALUM & MAG HYDROXIDE-SIMETH 200-200-20 MG/5ML PO SUSP: 15.0000 mL | ORAL | Status: DC | PRN

## 2019-08-16 MED ORDER — GLYCOPYRROLATE PF 0.2 MG/ML IJ SOSY
PREFILLED_SYRINGE | INTRAMUSCULAR | Status: DC | PRN
  Administered 2019-08-16: .1 mg via INTRAVENOUS

## 2019-08-16 MED ORDER — PHENYLEPHRINE 40 MCG/ML (10ML) SYRINGE FOR IV PUSH (FOR BLOOD PRESSURE SUPPORT)
PREFILLED_SYRINGE | INTRAVENOUS | Status: AC
  Filled 2019-08-16: qty 10

## 2019-08-16 MED ORDER — HEPARIN SODIUM (PORCINE) 5000 UNIT/ML IJ SOLN
5000.0000 [IU] | Freq: Three times a day (TID) | INTRAMUSCULAR | Status: DC
  Administered 2019-08-17 – 2019-08-21 (×13): 5000 [IU] via SUBCUTANEOUS
  Filled 2019-08-16 (×13): qty 1

## 2019-08-16 MED ORDER — THROMBIN (RECOMBINANT) 20000 UNITS EX SOLR
CUTANEOUS | Status: AC
  Filled 2019-08-16: qty 20000

## 2019-08-16 MED ORDER — PANTOPRAZOLE SODIUM 40 MG PO TBEC
40.0000 mg | DELAYED_RELEASE_TABLET | Freq: Every day | ORAL | Status: DC
  Administered 2019-08-17 – 2019-08-21 (×5): 40 mg via ORAL
  Filled 2019-08-16 (×6): qty 1

## 2019-08-16 MED ORDER — GUAIFENESIN-DM 100-10 MG/5ML PO SYRP: 15.0000 mL | ORAL_SOLUTION | ORAL | Status: DC | PRN

## 2019-08-16 MED ORDER — ONDANSETRON HCL 4 MG/2ML IJ SOLN
INTRAMUSCULAR | Status: AC
  Filled 2019-08-16: qty 2

## 2019-08-16 MED ORDER — FENTANYL CITRATE (PF) 250 MCG/5ML IJ SOLN
INTRAMUSCULAR | Status: AC
  Filled 2019-08-16: qty 5

## 2019-08-16 MED ORDER — PAPAVERINE HCL 30 MG/ML IJ SOLN
INTRAMUSCULAR | Status: AC
  Filled 2019-08-16: qty 2

## 2019-08-16 MED ORDER — PROPOFOL 10 MG/ML IV BOLUS
INTRAVENOUS | Status: AC
  Filled 2019-08-16: qty 40

## 2019-08-16 MED ORDER — CHLORHEXIDINE GLUCONATE CLOTH 2 % EX PADS: 6.0000 | MEDICATED_PAD | Freq: Once | CUTANEOUS | Status: DC

## 2019-08-16 MED ORDER — LACTATED RINGERS IV SOLN: INTRAVENOUS | Status: DC | PRN

## 2019-08-16 MED ORDER — SODIUM CHLORIDE 0.9 % IV SOLN
INTRAVENOUS | Status: AC
  Filled 2019-08-16: qty 1.2

## 2019-08-16 MED ORDER — DOCUSATE SODIUM 100 MG PO CAPS
100.0000 mg | ORAL_CAPSULE | Freq: Every day | ORAL | Status: DC
  Administered 2019-08-17 – 2019-08-21 (×5): 100 mg via ORAL
  Filled 2019-08-16 (×6): qty 1

## 2019-08-16 MED ORDER — CEFAZOLIN SODIUM-DEXTROSE 2-4 GM/100ML-% IV SOLN
2.0000 g | Freq: Three times a day (TID) | INTRAVENOUS | Status: AC
  Administered 2019-08-16 – 2019-08-17 (×2): 2 g via INTRAVENOUS
  Filled 2019-08-16 (×2): qty 100

## 2019-08-16 MED ORDER — MIDAZOLAM HCL 2 MG/2ML IJ SOLN
INTRAMUSCULAR | Status: AC
  Filled 2019-08-16: qty 2

## 2019-08-16 MED ORDER — ONDANSETRON HCL 4 MG/2ML IJ SOLN
INTRAMUSCULAR | Status: DC | PRN
  Administered 2019-08-16: 4 mg via INTRAVENOUS

## 2019-08-16 MED ORDER — CHLORHEXIDINE GLUCONATE CLOTH 2 % EX PADS
6.0000 | MEDICATED_PAD | Freq: Every day | CUTANEOUS | Status: DC
  Administered 2019-08-16 – 2019-08-21 (×5): 6 via TOPICAL

## 2019-08-16 MED ORDER — BISACODYL 10 MG RE SUPP: 10.0000 mg | Freq: Every day | RECTAL | Status: DC | PRN

## 2019-08-16 MED ORDER — MAGNESIUM SULFATE 2 GM/50ML IV SOLN: 2.0000 g | Freq: Every day | INTRAVENOUS | Status: DC | PRN

## 2019-08-16 MED ORDER — METOPROLOL TARTRATE 5 MG/5ML IV SOLN: 2.0000 mg | INTRAVENOUS | Status: DC | PRN

## 2019-08-16 MED ORDER — CEFAZOLIN SODIUM-DEXTROSE 2-4 GM/100ML-% IV SOLN
2.0000 g | INTRAVENOUS | Status: AC
  Administered 2019-08-16: 2 g via INTRAVENOUS
  Filled 2019-08-16: qty 100

## 2019-08-16 MED ORDER — ROCURONIUM BROMIDE 50 MG/5ML IV SOSY
PREFILLED_SYRINGE | INTRAVENOUS | Status: DC | PRN
  Administered 2019-08-16: 50 mg via INTRAVENOUS
  Administered 2019-08-16: 15 mg via INTRAVENOUS
  Administered 2019-08-16: 30 mg via INTRAVENOUS

## 2019-08-16 MED ORDER — PROTAMINE SULFATE 10 MG/ML IV SOLN
INTRAVENOUS | Status: DC | PRN
  Administered 2019-08-16: 40 mg via INTRAVENOUS

## 2019-08-16 MED ORDER — SODIUM CHLORIDE 0.9 % IV SOLN
INTRAVENOUS | Status: DC | PRN
  Administered 2019-08-16: 500 mL

## 2019-08-16 MED ORDER — ACETAMINOPHEN 650 MG RE SUPP: 325.0000 mg | RECTAL | Status: DC | PRN

## 2019-08-16 MED ORDER — SODIUM CHLORIDE 0.9 % IV SOLN: 500.0000 mL | Freq: Once | INTRAVENOUS | Status: DC | PRN

## 2019-08-16 MED ORDER — EPHEDRINE SULFATE 50 MG/ML IJ SOLN
INTRAMUSCULAR | Status: DC | PRN
  Administered 2019-08-16: 5 mg via INTRAVENOUS

## 2019-08-16 MED ORDER — ESMOLOL HCL 100 MG/10ML IV SOLN
INTRAVENOUS | Status: DC | PRN
  Administered 2019-08-16: 20 mg via INTRAVENOUS

## 2019-08-16 MED ORDER — LABETALOL HCL 5 MG/ML IV SOLN: 10.0000 mg | INTRAVENOUS | Status: DC | PRN

## 2019-08-16 MED ORDER — ASPIRIN EC 81 MG PO TBEC
81.0000 mg | DELAYED_RELEASE_TABLET | Freq: Every day | ORAL | Status: DC
  Administered 2019-08-17 – 2019-08-21 (×5): 81 mg via ORAL
  Filled 2019-08-16 (×5): qty 1

## 2019-08-16 MED ORDER — PHENYLEPHRINE HCL-NACL 10-0.9 MG/250ML-% IV SOLN
INTRAVENOUS | Status: DC | PRN
  Administered 2019-08-16: 25 ug/min via INTRAVENOUS

## 2019-08-16 MED ORDER — FENTANYL CITRATE (PF) 100 MCG/2ML IJ SOLN
INTRAMUSCULAR | Status: DC | PRN
  Administered 2019-08-16: 150 ug via INTRAVENOUS
  Administered 2019-08-16 (×4): 50 ug via INTRAVENOUS

## 2019-08-16 MED ORDER — 0.9 % SODIUM CHLORIDE (POUR BTL) OPTIME
TOPICAL | Status: DC | PRN
  Administered 2019-08-16: 1000 mL

## 2019-08-16 MED ORDER — HYDRALAZINE HCL 20 MG/ML IJ SOLN: 5.0000 mg | INTRAMUSCULAR | Status: DC | PRN

## 2019-08-16 MED ORDER — LISINOPRIL 20 MG PO TABS
20.0000 mg | ORAL_TABLET | Freq: Every day | ORAL | Status: DC
  Administered 2019-08-17 – 2019-08-21 (×5): 20 mg via ORAL
  Filled 2019-08-16 (×6): qty 1

## 2019-08-16 MED ORDER — DEXAMETHASONE SODIUM PHOSPHATE 10 MG/ML IJ SOLN
INTRAMUSCULAR | Status: AC
  Filled 2019-08-16: qty 1

## 2019-08-16 MED ORDER — SUCCINYLCHOLINE CHLORIDE 200 MG/10ML IV SOSY
PREFILLED_SYRINGE | INTRAVENOUS | Status: DC | PRN
  Administered 2019-08-16: 160 mg via INTRAVENOUS

## 2019-08-16 MED ORDER — ROCURONIUM BROMIDE 10 MG/ML (PF) SYRINGE
PREFILLED_SYRINGE | INTRAVENOUS | Status: AC
  Filled 2019-08-16: qty 10

## 2019-08-16 MED ORDER — PHENYLEPHRINE HCL (PRESSORS) 10 MG/ML IV SOLN
INTRAVENOUS | Status: DC | PRN
  Administered 2019-08-16: 80 ug via INTRAVENOUS

## 2019-08-16 MED ORDER — POLYETHYLENE GLYCOL 3350 17 G PO PACK: 17.0000 g | PACK | Freq: Every day | ORAL | Status: DC | PRN

## 2019-08-16 MED ORDER — MORPHINE SULFATE (PF) 2 MG/ML IV SOLN
2.0000 mg | INTRAVENOUS | Status: DC | PRN
  Administered 2019-08-16 – 2019-08-18 (×8): 2 mg via INTRAVENOUS
  Filled 2019-08-16 (×8): qty 1

## 2019-08-16 MED ORDER — DEXAMETHASONE SODIUM PHOSPHATE 10 MG/ML IJ SOLN
INTRAMUSCULAR | Status: DC | PRN
  Administered 2019-08-16: 5 mg via INTRAVENOUS

## 2019-08-16 MED ORDER — ESMOLOL HCL 100 MG/10ML IV SOLN
INTRAVENOUS | Status: AC
  Filled 2019-08-16: qty 10

## 2019-08-16 MED ORDER — ALBUMIN HUMAN 5 % IV SOLN: INTRAVENOUS | Status: DC | PRN

## 2019-08-16 MED ORDER — GLYCOPYRROLATE PF 0.2 MG/ML IJ SOSY
PREFILLED_SYRINGE | INTRAMUSCULAR | Status: AC
  Filled 2019-08-16: qty 1

## 2019-08-16 MED ORDER — MIDAZOLAM HCL 5 MG/5ML IJ SOLN
INTRAMUSCULAR | Status: DC | PRN
  Administered 2019-08-16: 2 mg via INTRAVENOUS

## 2019-08-16 MED ORDER — HEPARIN SODIUM (PORCINE) 1000 UNIT/ML IJ SOLN
INTRAMUSCULAR | Status: DC | PRN
  Administered 2019-08-16: 9000 [IU] via INTRAVENOUS

## 2019-08-16 MED ORDER — PAPAVERINE HCL 30 MG/ML IJ SOLN
INTRAMUSCULAR | Status: DC | PRN
  Administered 2019-08-16: 60 mg

## 2019-08-16 MED ORDER — MUPIROCIN 2 % EX OINT
TOPICAL_OINTMENT | CUTANEOUS | Status: AC
  Filled 2019-08-16: qty 22

## 2019-08-16 MED ORDER — ATORVASTATIN CALCIUM 10 MG PO TABS: 10.0000 mg | ORAL_TABLET | Freq: Every day | ORAL | Status: DC

## 2019-08-16 MED ORDER — PROPOFOL 10 MG/ML IV BOLUS
INTRAVENOUS | Status: DC | PRN
  Administered 2019-08-16: 200 mg via INTRAVENOUS

## 2019-08-16 MED ORDER — SUCCINYLCHOLINE CHLORIDE 200 MG/10ML IV SOSY
PREFILLED_SYRINGE | INTRAVENOUS | Status: AC
  Filled 2019-08-16: qty 10

## 2019-08-16 MED ORDER — POTASSIUM CHLORIDE CRYS ER 20 MEQ PO TBCR: 20.0000 meq | EXTENDED_RELEASE_TABLET | Freq: Every day | ORAL | Status: DC | PRN

## 2019-08-16 MED ORDER — SUGAMMADEX SODIUM 200 MG/2ML IV SOLN
INTRAVENOUS | Status: DC | PRN
  Administered 2019-08-16: 200 mg via INTRAVENOUS

## 2019-08-16 MED ORDER — IOHEXOL 300 MG/ML  SOLN
INTRAMUSCULAR | Status: DC | PRN
  Administered 2019-08-16: 10 mL via INTRA_ARTERIAL

## 2019-08-16 MED ORDER — PHENOL 1.4 % MT LIQD: 1.0000 | OROMUCOSAL | Status: DC | PRN

## 2019-08-16 MED ORDER — LIDOCAINE HCL (CARDIAC) PF 100 MG/5ML IV SOSY
PREFILLED_SYRINGE | INTRAVENOUS | Status: DC | PRN
  Administered 2019-08-16: 60 mg via INTRAVENOUS

## 2019-08-16 SURGICAL SUPPLY — 61 items
BANDAGE ESMARK 6X9 LF (GAUZE/BANDAGES/DRESSINGS) IMPLANT
BNDG ESMARK 6X9 LF (GAUZE/BANDAGES/DRESSINGS)
BNDG GAUZE ELAST 4 BULKY (GAUZE/BANDAGES/DRESSINGS) ×3 IMPLANT
CANISTER SUCT 3000ML PPV (MISCELLANEOUS) ×3 IMPLANT
CANNULA VESSEL 3MM 2 BLNT TIP (CANNULA) ×9 IMPLANT
CLIP VESOCCLUDE MED 24/CT (CLIP) ×3 IMPLANT
CLIP VESOCCLUDE SM WIDE 24/CT (CLIP) ×6 IMPLANT
COVER PROBE W GEL 5X96 (DRAPES) ×3 IMPLANT
COVER WAND RF STERILE (DRAPES) IMPLANT
CUFF TOURN SGL QUICK 24 (TOURNIQUET CUFF) ×1
CUFF TOURN SGL QUICK 34 (TOURNIQUET CUFF)
CUFF TOURN SGL QUICK 42 (TOURNIQUET CUFF) IMPLANT
CUFF TRNQT CYL 24X4X16.5-23 (TOURNIQUET CUFF) ×2 IMPLANT
CUFF TRNQT CYL 34X4.125X (TOURNIQUET CUFF) IMPLANT
DERMABOND ADVANCED (GAUZE/BANDAGES/DRESSINGS) ×2
DERMABOND ADVANCED .7 DNX12 (GAUZE/BANDAGES/DRESSINGS) ×4 IMPLANT
DRAIN CHANNEL 15F RND FF W/TCR (WOUND CARE) IMPLANT
DRAPE HALF SHEET 40X57 (DRAPES) IMPLANT
DRAPE X-RAY CASS 24X20 (DRAPES) ×3 IMPLANT
ELECT REM PT RETURN 9FT ADLT (ELECTROSURGICAL) ×3
ELECTRODE REM PT RTRN 9FT ADLT (ELECTROSURGICAL) ×2 IMPLANT
EVACUATOR SILICONE 100CC (DRAIN) IMPLANT
GAUZE 4X4 16PLY RFD (DISPOSABLE) ×3 IMPLANT
GLOVE BIO SURGEON STRL SZ7.5 (GLOVE) ×3 IMPLANT
GLOVE BIOGEL PI IND STRL 8 (GLOVE) ×2 IMPLANT
GLOVE BIOGEL PI INDICATOR 8 (GLOVE) ×1
GOWN STRL REUS W/ TWL LRG LVL3 (GOWN DISPOSABLE) ×6 IMPLANT
GOWN STRL REUS W/TWL LRG LVL3 (GOWN DISPOSABLE) ×3
KIT BASIN OR (CUSTOM PROCEDURE TRAY) ×3 IMPLANT
KIT TURNOVER KIT B (KITS) ×3 IMPLANT
MARKER GRAFT CORONARY BYPASS (MISCELLANEOUS) ×3 IMPLANT
NEEDLE 18GX1X1/2 (RX/OR ONLY) (NEEDLE) ×3 IMPLANT
NS IRRIG 1000ML POUR BTL (IV SOLUTION) ×6 IMPLANT
PACK PERIPHERAL VASCULAR (CUSTOM PROCEDURE TRAY) ×3 IMPLANT
PAD ARMBOARD 7.5X6 YLW CONV (MISCELLANEOUS) ×6 IMPLANT
PENCIL SMOKE EVACUATOR (MISCELLANEOUS) ×3 IMPLANT
SET COLLECT BLD 21X3/4 12 (NEEDLE) IMPLANT
SET COLLECT BLD 21X3/4 12 PB (MISCELLANEOUS) ×3 IMPLANT
SPONGE LAP 18X18 RF (DISPOSABLE) ×3 IMPLANT
SPONGE SURGIFOAM ABS GEL 100 (HEMOSTASIS) IMPLANT
STOPCOCK 4 WAY LG BORE MALE ST (IV SETS) ×3 IMPLANT
SUT ETHILON 3 0 PS 1 (SUTURE) IMPLANT
SUT PROLENE 5 0 C 1 24 (SUTURE) ×3 IMPLANT
SUT PROLENE 6 0 BV (SUTURE) ×18 IMPLANT
SUT PROLENE 7 0 BV 1 (SUTURE) ×6 IMPLANT
SUT SILK 2 0 SH (SUTURE) ×6 IMPLANT
SUT SILK 3 0 (SUTURE) ×1
SUT SILK 3-0 18XBRD TIE 12 (SUTURE) ×2 IMPLANT
SUT VIC AB 2-0 CT1 27 (SUTURE) ×1
SUT VIC AB 2-0 CT1 TAPERPNT 27 (SUTURE) ×2 IMPLANT
SUT VIC AB 2-0 CTB1 (SUTURE) ×3 IMPLANT
SUT VIC AB 3-0 SH 27 (SUTURE) ×3
SUT VIC AB 3-0 SH 27X BRD (SUTURE) ×6 IMPLANT
SUT VICRYL 4-0 PS2 18IN ABS (SUTURE) ×9 IMPLANT
SYR 20ML LL LF (SYRINGE) ×6 IMPLANT
SYR 3ML LL SCALE MARK (SYRINGE) ×3 IMPLANT
TOWEL GREEN STERILE (TOWEL DISPOSABLE) ×3 IMPLANT
TRAY FOLEY MTR SLVR 16FR STAT (SET/KITS/TRAYS/PACK) ×3 IMPLANT
TUBING EXTENTION W/L.L. (IV SETS) ×3 IMPLANT
UNDERPAD 30X30 (UNDERPADS AND DIAPERS) ×3 IMPLANT
WATER STERILE IRR 1000ML POUR (IV SOLUTION) ×3 IMPLANT

## 2019-08-16 NOTE — Progress Notes (Signed)
Called Dr. Maple Hudson (No answer) and night anesthesiologist to inform of cocaine use on 08/13/19.  No answer.  Per Lindsi, hold urine.

## 2019-08-16 NOTE — Progress Notes (Signed)
Patient moved to negative pressure room and is on airborne and contact precautions.

## 2019-08-16 NOTE — Progress Notes (Signed)
PT Cancellation Note  Patient Details Name: Mitchell Richard MRN: [id number] DOB: 1960-12-24   Cancelled Treatment:    Reason Eval/Treat Not Completed: Patient at procedure or test/unavailable  Patient just received lunch tray and wants to eat. Agreed he wants to get up after he eats. Will try again this pm   Jerolyn Center, PT Pager [phone]  Zena Amos 08/16/2019, 3:45 PM

## 2019-08-16 NOTE — Transfer of Care (Signed)
Immediate Anesthesia Transfer of Care Note  Patient: Mitchell Richard  Procedure(s) Performed: BYPASS GRAFT, right common FEMORAL artery to ABOVE KNEE POPLITEAL ARTERY using right non-reversed greater saphenous vein (Right Leg Upper) Intra Operative Arteriogram, right lower extremity (Right Leg Lower)  Patient Location: PACU  Anesthesia Type:General  Level of Consciousness: awake, alert  and oriented  Airway & Oxygen Therapy: Patient Spontanous Breathing and Patient connected to face mask oxygen  Post-op Assessment: Report given to RN and Post -op Vital signs reviewed and stable  Post vital signs: Reviewed and stable  Last Vitals:  Vitals Value Taken Time  BP    Temp    Pulse    Resp    SpO2      Last Pain:  Vitals:   08/16/19 1242  TempSrc:   PainSc: 0-No pain         Complications: No apparent anesthesia complications

## 2019-08-16 NOTE — Anesthesia Procedure Notes (Signed)
Arterial Line Insertion Start/End1/26/2021 8:54 AM, 08/16/2019 8:55 AM Performed by: Val Eagle, MD, anesthesiologist  Patient location: OR. Preanesthetic checklist: patient identified, IV checked, site marked, risks and benefits discussed, surgical consent, monitors and equipment checked, pre-op evaluation, timeout performed and anesthesia consent Patient sedated Left, radial was placed Catheter size: 20 G Hand hygiene performed  and maximum sterile barriers used  Allen's test indicative of satisfactory collateral circulation Attempts: 1 Procedure performed without using ultrasound guided technique. Ultrasound Notes:anatomy identified Following insertion, dressing applied. Post procedure assessment: normal  Patient tolerated the procedure well with no immediate complications.

## 2019-08-16 NOTE — Progress Notes (Signed)
Called Dr. Maple Hudson again.  Busy signal.  Will try again.

## 2019-08-16 NOTE — Evaluation (Signed)
Physical Therapy Evaluation Patient Details Name: Mitchell Richard MRN: [id number] DOB: 1961/05/13 Today's Date: 08/16/2019   History of Present Illness  59 y.o. male who presented 08/16/19 for RLE bypass graft due to a nonhealing wound on his right Achilles.  He has been followed in the wound care center. He is not diabetic. Found to be COVID + Underwent Rt fem-pop bypass.  PMH-HTN PAD PTSD  Clinical Impression   Patient is s/p above surgery resulting in functional limitations due to the deficits listed below (see PT Problem List). Patient was only able to briefly sit at EOB due to pain and incision began oozing blood. Patient insists he cannot go to his sister's home on discharge (due to [place] +) and that [facility]. Patient's cognition is decreased and doubt this is the discharge plan. Will continue to assess discharge needs as he becomes more mobile.  Patient will benefit from skilled PT to increase their independence and safety with mobility to allow discharge to the venue listed below.       Follow Up Recommendations Home health PT;Supervision for mobility/OOB    Equipment Recommendations  None recommended by PT    Recommendations for Other Services       Precautions / Restrictions Precautions Precautions: Fall Restrictions Weight Bearing Restrictions: No      Mobility  Bed Mobility Overal bed mobility: Needs Assistance Bed Mobility: Supine to Sit;Sit to Supine     Supine to sit: Supervision Sit to supine: Min assist   General bed mobility comments: leg began bleeding upon sitting EOB; required assist to raise leg back onto bed  Transfers                 General transfer comment: unable due to incision bleeding  Ambulation/Gait                Stairs            Wheelchair Mobility    Modified Rankin (Stroke Patients Only)       Balance                                              Pertinent Vitals/Pain Pain Assessment: Faces Faces Pain Scale: Hurts whole lot Pain Location: RLE Pain Descriptors / Indicators: Grimacing;Guarding;Sharp Pain Intervention(s): Limited activity within patient's tolerance;Monitored during session;Repositioned    Home Living Family/patient expects to be discharged to:: Unsure Living Arrangements: Other relatives(sister, nephew)     Home Access: Stairs to enter   Technical brewer of Steps: 5   Home Equipment: Cane - single point;Walker - 2 wheels Additional Comments: pt insists [facility] going [facility] in [facility] for him to quarantine so he won't have to go to his sister's home; then he will go to [place] "home" with nurses and people who are recovering    Prior Function Level of Independence: Independent with assistive device(s)         Comments: used cane PTA     Hand Dominance        Extremity/Trunk Assessment   Upper Extremity Assessment Upper Extremity Assessment: Overall WFL for tasks assessed    Lower Extremity Assessment Lower Extremity Assessment: RLE deficits/detail RLE Deficits / Details: pt able to bend to ~45'degrees hip and knee flexion; upon move to sit EOB, his thigh  incision began to bleed and returned to supine    Cervical / Trunk Assessment Cervical / Trunk Assessment: Normal  Communication   Communication: No difficulties  Cognition Arousal/Alertness: Awake/alert Behavior During Therapy: Restless Overall Cognitive Status: No family/caregiver present to determine baseline cognitive functioning                                 General Comments: pt surprised his leg had been "cut on" when covers removed; very distracted/bothered by lines/monitors      General Comments General comments (skin integrity, edema, etc.): Patient with seemingly poor awareness of his deficits and how this will impact his function. He had significant pain with lowering RLE over EOB and yet once  returned to supine, kept insisting he wanted to walk with only his cane. "let's go now" reminded pt his leg was still bleeding and needed to stay in bed.     Exercises     Assessment/Plan    PT Assessment Patient needs continued PT services  PT Problem List Decreased range of motion;Decreased activity tolerance;Decreased balance;Decreased mobility;Decreased cognition;Decreased knowledge of use of DME;Decreased safety awareness;Decreased knowledge of precautions;Pain;Decreased skin integrity       PT Treatment Interventions Gait training;DME instruction;Functional mobility training;Therapeutic activities;Therapeutic exercise;Cognitive remediation;Patient/family education    PT Goals (Current goals can be found in the Care Plan section)  Acute Rehab PT Goals Patient Stated Goal: be able to walk with cane PT Goal Formulation: With patient Time For Goal Achievement: 08/30/19 Potential to Achieve Goals: Good    Frequency Min 3X/week   Barriers to discharge Decreased caregiver support reports he cannot return to his sister's now that he has COVID    Co-evaluation               AM-PAC PT "6 Clicks" Mobility  Outcome Measure Help needed turning from your back to your side while in a flat bed without using bedrails?: A Little Help needed moving from lying on your back to sitting on the side of a flat bed without using bedrails?: A Little Help needed moving to and from a bed to a chair (including a wheelchair)?: A Little Help needed standing up from a chair using your arms (e.g., wheelchair or bedside chair)?: A Little Help needed to walk in hospital room?: Total Help needed climbing 3-5 steps with a railing? : Total 6 Click Score: 14    End of Session   Activity Tolerance: Treatment limited secondary to medical complications (Comment) Patient left: in bed;with call bell/phone within reach;with bed alarm set Nurse Communication: Mobility status;Other (comment)(RN called and  came to observe incision) PT Visit Diagnosis: Other abnormalities of gait and mobility (R26.89);Pain Pain - Right/Left: Right Pain - part of body: Leg    Time: 1950-9326 PT Time Calculation (min) (ACUTE ONLY): 28 min   Charges:   PT Evaluation $PT Eval Low Complexity: 1 Low PT Treatments $Therapeutic Activity: 8-22 mins         Jerolyn Center, PT Pager [phone]   Zena Amos 08/16/2019, 5:36 PM

## 2019-08-16 NOTE — Op Note (Signed)
NAME: Mitchell Richard    MRN: [id number] DOB: 12/09/1960    DATE OF OPERATION: 08/16/2019  PREOP DIAGNOSIS:    Critical limb ischemia right lower extremity  POSTOP DIAGNOSIS:    Same  PROCEDURE:    Right common femoral to above-knee popliteal artery bypass with nonreversed translocated saphenous vein graft Intraoperative arteriogram  SURGEON: Judeth Cornfield. Scot Dock, MD  ASSIST: Leontine Locket  ANESTHESIA: General  EBL: Minimal  INDICATIONS:    Mitchell Richard is a 59 y.o. male who presented with a nonhealing wound of his Achilles.  He had a superficial femoral artery occlusion and presents for right lower extremity bypass as his best chance for limb salvage.  Of note he was found to be Covid positive on the morning of admission however given that he had critical limb ischemia I felt we should proceed as scheduled.  FINDINGS:   Brisk posterior tibial and anterior tibial signal at the completion of the procedure.  Intraoperative arteriogram with find no technical problems noted.  TECHNIQUE:   The patient was taken to the operating room and received a general anesthetic.  The lower abdomen right groin and right lower extremity were prepped and draped in usual sterile fashion.  Longitudinal incision was made in the right groin and dissection carried down to the common femoral artery which was soft and had a good pulse.  I controlled the artery proximally with a vessel loop.  The deep femoral artery, a posterior branch, and the superficial femoral arteries were controlled with Vesseloops.  Through this incision the saphenofemoral junction was dissected free.  Using 3 additional incisions along the medial aspect of the right leg the great saphenous vein was harvested to just above the knee.  Branches were divided tween clips and 3-0 silk ties.  Through the distal incision I exposed the above-knee popliteal artery which was patent at this level.  Patient was heparinized after a tunnel was  created from the distal incision to the groin incision.  Saphenous vein was ligated proximally and then irrigated up nicely with heparinized saline.  It had been ligated distally.  It was spatulated proximally used in a nonreversed fashion.  The common femoral, deep femoral, and superficial femoral arteries were controlled and a longitudinal arteriotomy made in the distal common femoral artery.  The vein was sewn into side to the artery using continuous 6-0 Prolene suture, and a radiopaque marker was placed around the proximal anastomosis.  Prior to completing the anastomosis the artery was backbled and flushed and the anastomosis completed.  There was good inflow.  Using a retrograde [place] valvulotome the valves were excised.  Excellent flow was established to the graft which was then flushed with heparinized saline and clips.  It was then marked to prevent twisting.  It was brought through the previously created tunnel.  Turning was placed on the thigh.  Leg was exsanguinated with an Esmarch bandage and the tourniquet inflated to 250 mmHg.  Under tourniquet control a longitudinal arteriotomy was made in the above-knee popliteal artery.  The vein was spatulated and sewn end-to-side to the above-knee popliteal artery using a continuous 6-0 Prolene suture.  Prior to completing this anastomosis the artery was backbled and flushed appropriately and the anastomosis completed.  There was excellent flow through the graft when the tourniquet was released.  Hemostasis was obtained of the wounds.  There was a brisk anterior tibial and posterior tibial signal with a Doppler completion of the procedure which was graft dependent.  The  groin incision was closed with a deep layer of 2-0 Vicryl, the subcutaneous layer was closed with 3-0 Vicryl and the skin closed with 4-0 Vicryl.  The vein harvest incisions were closed with a deep layer of 3-0 Vicryl and skin closed with 4-0 Vicryl.  The distal incision was closed with deep  layer of 2-0 Vicryl, a subcutaneous layer with 3-0 Vicryl and the skin closed with 4-0 Vicryl.  Dermabond was applied.  Patient tolerated procedure well.  And will be transferred directly to the Covid unit postoperatively.  All needle and sponge counts were correct.  Waverly Ferrari, MD, FACS [facility]  DATE OF DICTATION:   08/16/2019

## 2019-08-16 NOTE — Telephone Encounter (Signed)
Patient was contacted by Dr.O'Neal to discuss, he has an appointment rescheduled for 08/31/2019.

## 2019-08-16 NOTE — Anesthesia Procedure Notes (Signed)
Arterial Line Insertion Start/End1/26/2021 8:54 AM, 08/16/2019 9:01 AM Performed by: Val Eagle, MD, anesthesiologist  Patient location: OR. Preanesthetic checklist: patient identified, IV checked, site marked, risks and benefits discussed, surgical consent, monitors and equipment checked, pre-op evaluation, timeout performed and anesthesia consent Patient sedated Left, radial was placed Catheter size: 20 G Hand hygiene performed  and maximum sterile barriers used   Attempts: 1 Procedure performed without using ultrasound guided technique. Following insertion, dressing applied. Post procedure assessment: normal and unchanged  Patient tolerated the procedure well with no immediate complications.
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
## 2019-08-16 NOTE — Anesthesia Procedure Notes (Signed)
Procedure Name: Intubation Date/Time: 08/16/2019 8:51 AM Performed by: Inda Coke, CRNA Pre-anesthesia Checklist: Patient identified, Emergency Drugs available, Suction available and Patient being monitored Patient Re-evaluated:Patient Re-evaluated prior to induction Oxygen Delivery Method: Circle System Utilized Preoxygenation: Pre-oxygenation with 100% oxygen Induction Type: IV induction Ventilation: Mask ventilation without difficulty Laryngoscope Size: Mac and 4 Grade View: Grade I Tube type: Oral Tube size: 7.5 mm Number of attempts: 1 Airway Equipment and Method: Stylet and Oral airway Placement Confirmation: ETT inserted through vocal cords under direct vision,  positive ETCO2 and breath sounds checked- equal and bilateral Secured at: 22 cm Tube secured with: Tape Dental Injury: Teeth and Oropharynx as per pre-operative assessment

## 2019-08-16 NOTE — Discharge Instructions (Signed)
 [facility] [place]  Discharge instructions  Lower Extremity Bypass Surgery  Please refer to the following instruction for your post-procedure care. Your surgeon or physician assistant will discuss any changes with you.  Activity  You are encouraged to walk as much as you can. You can slowly return to normal activities during the month after your surgery. Avoid strenuous activity and heavy lifting until your doctor tells you it's OK. Avoid activities such as vacuuming or swinging a golf club. Do not drive until your doctor give the OK and you are no longer taking prescription pain medications. It is also normal to have difficulty with sleep habits, eating and bowel movement after surgery. These will go away with time.  Bathing/Showering  Shower daily after you go home. Do not soak in a bathtub, hot tub, or swim until the incision heals completely.  Incision Care  Clean your incision with mild soap and water. Shower every day. Pat the area dry with a clean towel. You do not need a bandage unless otherwise instructed. Do not apply any ointments or creams to your incision. If you have open wounds you will be instructed how to care for them or a visiting nurse may be arranged for you. If you have staples or sutures along your incision they will be removed at your post-op appointment. You may have skin glue on your incision. Do not peel it off. It will come off on its own in about one week.  Wash the groin wound with soap and water daily and pat dry. (No tub bath-only shower)  Then put a dry gauze or washcloth in the groin to keep this area dry to help prevent wound infection.  Do this daily and as needed.  Do not use Vaseline or neosporin on your incisions.  Only use soap and water on your incisions and then protect and keep dry.  Diet  Resume your normal diet. There are no special food restrictions following this procedure. A low fat/ low cholesterol diet is  recommended for all patients with vascular disease. In order to heal from your surgery, it is CRITICAL to get adequate nutrition. Your body requires vitamins, minerals, and protein. Vegetables are the best source of vitamins and minerals. Vegetables also provide the perfect balance of protein. Processed food has little nutritional value, so try to avoid this.  Medications  Resume taking all your medications unless your doctor or physician assistant tells you not to. If your incision is causing pain, you may take over-the-counter pain relievers such as acetaminophen (Tylenol). If you were prescribed a stronger pain medication, please aware these medication can cause nausea and constipation. Prevent nausea by taking the medication with a snack or meal. Avoid constipation by drinking plenty of fluids and eating foods with high amount of fiber, such as fruits, vegetables, and grains. Take Colace 100 mg (an over-the-counter stool softener) twice a day as needed for constipation.  Do not take Tylenol if you are taking prescription pain medications.  Follow Up  Our office will schedule a follow up appointment 2-3 weeks following discharge.  Please call us immediately for any of the following conditions  Severe or worsening pain in your legs or feet while at rest or while walking Increase pain, redness, warmth, or drainage (pus) from your incision site(s) Fever of 101 degree or higher The swelling in your leg with the bypass suddenly worsens and becomes more painful than when you were in the hospital If you have   been instructed to feel your graft pulse then you should do so every day. If you can no longer feel this pulse, call the office immediately. Not all patients are given this instruction.  Leg swelling is common after leg bypass surgery.  The swelling should improve over a few months following surgery. To improve the swelling, you may elevate your legs above the level of your heart while you are  sitting or resting. Your surgeon or physician assistant may ask you to apply an ACE wrap or wear compression (TED) stockings to help to reduce swelling.  Reduce your risk of vascular disease  Stop smoking. If you would like help call [facility] at 1-800-QUIT-NOW (1-800-784-8669) or [place] at 336-586-4000.  Manage your cholesterol Maintain a desired weight Control your diabetes weight Control your diabetes Keep your blood pressure down  If you have any questions, please call the office at [phone]  

## 2019-08-16 NOTE — Progress Notes (Signed)
Spoke to Dr. Maple Hudson.  Patient has positive covid result.  Dr. Edilia Bo aware.  Dr. Maple Hudson did not order urine drug screen.

## 2019-08-17 ENCOUNTER — Encounter: Payer: Self-pay | Admitting: *Deleted

## 2019-08-17 LAB — BASIC METABOLIC PANEL
Anion gap: 9 (ref 5–15)
BUN: 9 mg/dL (ref 8–23)
CO2: 24 mmol/L (ref 22–32)
Calcium: 8.3 mg/dL — ABNORMAL LOW (ref 8.9–10.3)
Chloride: 106 mmol/L (ref 98–111)
Creatinine, Ser: 1.07 mg/dL (ref 0.61–1.24)
GFR calc Af Amer: >60 mL/min (ref >60)
GFR calc non Af Amer: >60 mL/min (ref >60)
Glucose, Bld: 121 mg/dL — ABNORMAL HIGH (ref 70–99)
Potassium: 3.1 mmol/L — ABNORMAL LOW (ref 3.5–5.1)
Sodium: 139 mmol/L (ref 135–145)

## 2019-08-17 LAB — CBC
HCT: 35.3 % — ABNORMAL LOW (ref 39.0–52.0)
Hemoglobin: 12.5 g/dL — ABNORMAL LOW (ref 13.0–17.0)
MCH: 29.6 pg (ref 26.0–34.0)
MCHC: 35.4 g/dL (ref 30.0–36.0)
MCV: 83.6 fL (ref 80.0–100.0)
Platelets: 244 10*3/uL (ref 150–400)
RBC: 4.22 MIL/uL (ref 4.22–5.81)
RDW: 14.6 % (ref 11.5–15.5)
WBC: 11 10*3/uL — ABNORMAL HIGH (ref 4.0–10.5)
nRBC: 0 % (ref 0.0–0.2)

## 2019-08-17 MED ORDER — ATORVASTATIN CALCIUM 10 MG PO TABS
10.0000 mg | ORAL_TABLET | Freq: Every day | ORAL | Status: DC
  Administered 2019-08-17 – 2019-08-20 (×4): 10 mg via ORAL
  Filled 2019-08-17 (×4): qty 1

## 2019-08-17 NOTE — TOC Initial Note (Signed)
Transition of Care [facility]) - Initial/Assessment Note    Patient Details  Name: Mitchell Richard MRN: [id number] Date of Birth: [birth year]  Transition of [facility]) CM/SW Contact:    Nonda Lou, LCSW Phone Number: 08/17/2019, 12:56 PM  Clinical Narrative:                 CSW received consult for possible SNF placement at time of discharge. CSW spoke with patient regarding PT recommendation of SNF placement at time of discharge. Patient reported that he needs SNF to help with walking. Patient expressed understanding of PT recommendation and is agreeable to SNF placement at time of discharge. Patient reports preference for [facility]. CSW expressed [facility] not take Medicaid and PT stated "yes they do because my mother went there." Patient also stated he has VA benefits. CSW discussed insurance authorization process and informed PT he may need to sign over his Medicaid check for a 30 day SNF stay. PT stated he does not know anything about a check. CSW provided Medicare SNF ratings information and provided PT the names of facilities currently taking Covid patients. PT stated he does not want to go to a Covid SNF considering he feels fine and he does not want to risk getting sicker. CSW informed PT that although he feels fine he is currently testing positive. PT asked when he will be discharged and CSW informed him the doctor will keep him updated on when he is medically cleared to leave. Patient expressed being hopeful for rehab and to feel better soon. No further questions reported at this time. CSW to continue to follow and assist with discharge planning needs.  Expected Discharge Plan: Skilled Nursing Facility Barriers to Discharge: Continued Medical Work up, SNF Pending bed offer   Patient Goals and CMS Choice Patient states their goals for this hospitalization and ongoing recovery are:: Getting better. CMS Medicare.gov Compare Post Acute Care list provided to:: Patient Choice offered to / list  presented to : Patient  Expected Discharge Plan and Services Expected Discharge Plan: Skilled Nursing Facility In-house Referral: Clinical Social Work   Post Acute Care Choice: Skilled Nursing Facility Living arrangements for the past 2 months: Single Family Home                                      Prior Living Arrangements/Services Living arrangements for the past 2 months: Single Family Home Lives with:: Siblings Patient language and need for interpreter reviewed:: No Do you feel safe going back to the place where you live?: Yes      Need for Family Participation in Patient Care: No (Comment) Care giver support system in place?: Yes (comment)   Criminal Activity/Legal Involvement Pertinent to Current Situation/Hospitalization: No - Comment as needed  Activities of Daily Living Home Assistive Devices/Equipment: Cane (specify quad or straight) ADL Screening (condition at time of admission) Patient's cognitive ability adequate to safely complete daily activities?: Yes Is the patient deaf or have difficulty hearing?: No Does the patient have difficulty seeing, even when wearing glasses/contacts?: No Does the patient have difficulty concentrating, remembering, or making decisions?: No Patient able to express need for assistance with ADLs?: Yes Does the patient have difficulty dressing or bathing?: No Independently performs ADLs?: Yes (appropriate for developmental age) Does the patient have difficulty walking or climbing stairs?: Yes Weakness of Legs: Both Weakness of Arms/Hands: None  Permission Sought/Granted Permission sought to share  information with : Facility Art therapist granted to share information with : Yes, Verbal Permission Granted  Share Information with NAME: [facility]  Permission granted to share info w AGENCY: SNF  Permission granted to share info w Relationship: Sister  Permission granted to share info w Contact Information:  [phone]  Emotional Assessment   Attitude/Demeanor/Rapport: Self-Confident Affect (typically observed): Pleasant Orientation: : Oriented to Self, Oriented to Place, Oriented to  Time, Oriented to Situation Alcohol / Substance Use: Not Applicable Psych Involvement: No (comment)  Admission diagnosis:  PAD (peripheral artery disease) ([place]) [I73.9] Critical lower limb ischemia [I99.8] Patient Active Problem List   Diagnosis Date Noted  . PAD (peripheral artery disease) ([place]) 08/16/2019  . Critical lower limb ischemia 08/16/2019  . Hypertension   . 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 Ulcer of right heel, with necrosis of bone ([place])    PCP:  Marliss Coots, NP Pharmacy:   [facility] [address] [place], [place] [address] [address] Phone: [phone] Fax: [phone]  [place], [place] [address] Phone: [phone] Fax: [place], [place] [address] Phone: [phone] Fax: [phone]     Social Determinants of Health (SDOH) Interventions    Readmission Risk Interventions No flowsheet data found.

## 2019-08-17 NOTE — Progress Notes (Signed)
Physical Therapy Treatment Patient Details Name: Mitchell Richard MRN: [id number] DOB: 11/03/1960 Today's Date: 08/17/2019    History of Present Illness 59 y.o. male who presented 08/16/19 for RLE bypass graft due to a nonhealing wound on his right Achilles.  He has been followed in the wound care center. He is not diabetic. Found to be COVID + Underwent Rt fem-pop bypass.  PMH-HTN PAD PTSD    PT Comments    Pt is eager to get out of bed but admits fear of pain with ambulation. Pt is limited in safe mobility by decreased safety/deficit awareness and decreased knowledge of DME. Pt is supervision for bed mobility but requires min A for transfers and ambulation with RW. Pt with no idea of where he would go after leaving hospital and at this point needs assistance with mobility. PT changing discharge recommendation to SNF level rehab to regain safe mobility prior to d/c possibly back to his sister's house.     Follow Up Recommendations  SNF     Equipment Recommendations  None recommended by PT       Precautions / Restrictions Precautions Precautions: Fall Restrictions Weight Bearing Restrictions: No    Mobility  Bed Mobility Overal bed mobility: Needs Assistance Bed Mobility: Supine to Sit     Supine to sit: Supervision;HOB elevated     General bed mobility comments: increased time and effort as well as increased pain with R LE in dependent position   Transfers Overall transfer level: Needs assistance Equipment used: Rolling walker (2 wheeled) Transfers: Sit to/from Stand Sit to Stand: Min assist         General transfer comment: good power up, min A for steadying once in upright due to increased pain in R LE  Ambulation/Gait Ambulation/Gait assistance: Min assist Gait Distance (Feet): 22 Feet Assistive device: Rolling walker (2 wheeled) Gait Pattern/deviations: Step-to pattern;Decreased weight shift to right;Decreased stance time - right Gait velocity: slowed Gait  velocity interpretation: <1.31 ft/sec, indicative of household ambulator General Gait Details: min A for steadying with RW, initially pt with hop to patten, not putting R LE down on floor, with encouragement pt able to bear weight through R LE with UE support on RW, pt with c/o increased pain but able to progress to mostly putting his RLE on floor          Balance Overall balance assessment: Needs assistance Sitting-balance support: Feet supported;No upper extremity supported Sitting balance-Leahy Scale: Fair     Standing balance support: Bilateral upper extremity supported Standing balance-Leahy Scale: Poor Standing balance comment: requires UE assit for static balance                            Cognition Arousal/Alertness: Awake/alert Behavior During Therapy: Restless Overall Cognitive Status: No family/caregiver present to determine baseline cognitive functioning                                 General Comments: pt continues to be bothered by lines/monitors, asking to have them removed, when asked about d/c location agrees he can't go to his sisters because of COVID and then states "maybe I can just live in the woods."          General Comments General comments (skin integrity, edema, etc.): VSS on RA, continuest to have poor understanding of his deficits      Pertinent Vitals/Pain Pain Assessment: Faces  Faces Pain Scale: Hurts whole lot Pain Location: RLE Pain Descriptors / Indicators: Grimacing;Guarding;Sharp Pain Intervention(s): Limited activity within patient's tolerance;Monitored during session;Repositioned;Patient requesting pain meds-RN notified           PT Goals (current goals can now be found in the care plan section) Acute Rehab PT Goals Patient Stated Goal: be able to walk with cane PT Goal Formulation: With patient Time For Goal Achievement: 08/30/19 Potential to Achieve Goals: Good Progress towards PT goals: Progressing  toward goals    Frequency    Min 3X/week      PT Plan Discharge plan needs to be updated       AM-PAC PT "6 Clicks" Mobility   Outcome Measure  Help needed turning from your back to your side while in a flat bed without using bedrails?: A Little Help needed moving from lying on your back to sitting on the side of a flat bed without using bedrails?: A Little Help needed moving to and from a bed to a chair (including a wheelchair)?: A Little Help needed standing up from a chair using your arms (e.g., wheelchair or bedside chair)?: A Little Help needed to walk in hospital room?: Total Help needed climbing 3-5 steps with a railing? : Total 6 Click Score: 14    End of Session Equipment Utilized During Treatment: Gait belt Activity Tolerance: Treatment limited secondary to medical complications (Comment) Patient left: in bed;with call bell/phone within reach;with bed alarm set Nurse Communication: Mobility status;Other (comment)(RN called and came to observe incision) PT Visit Diagnosis: Other abnormalities of gait and mobility (R26.89);Pain Pain - Right/Left: Right Pain - part of body: Leg     Time: 8338-2505 PT Time Calculation (min) (ACUTE ONLY): 29 min  Charges:  $Gait Training: 23-37 mins                     Kahliya Fraleigh B. Beverely Risen PT, DPT Acute Rehabilitation Services Pager [phone] Office [phone]    Elon Alas Fleet 08/17/2019, 11:59 AM

## 2019-08-17 NOTE — Progress Notes (Signed)
   VASCULAR SURGERY ASSESSMENT & PLAN:   POD 1 RIGHT FEMORAL TO ABOVE-KNEE POPLITEAL ARTERY BYPASS (VEIN): His right foot and leg are warm.  Given that he is in Covid isolation unable to assess with Doppler.  RIGHT ACHILLES WOUND: Continue daily dressing changes.  This was being done by the home health nurse at home.  MOBILITY: Appreciate physical therapy's help yesterday.  They have recommended home health physical therapy.  DVT PROPHYLAXIS: He is on subcu heparin.  VASCULAR QUALITY INITIATIVE: He is on aspirin.  He is not on a statin so I have ordered that.  DISPOSITION: Home when ambulating and pain controlled on po pain medication.   SUBJECTIVE:   No specific complaints except for moderate pain from his incisions.  PHYSICAL EXAM:   Vitals:   08/16/19 1800 08/16/19 1900 08/17/19 0000 08/17/19 0500  BP: 137/89 139/87 118/85 122/78  Pulse: (!) 59 62 (!) 54 (!) 56  Resp: 17 16 17 17   Temp:   98.1 F (36.7 C) 98 F (36.7 C)  TempSrc:   Oral Oral  SpO2: 97% 98% 97% 96%  Weight:    86.1 kg  Height:       The right leg and foot is warm and well-perfused. His incisions look fine. Dressing on the right Achilles is dry.  LABS:   Lab Results  Component Value Date   WBC 11.0 (H) 08/17/2019   HGB 12.5 (L) 08/17/2019   HCT 35.3 (L) 08/17/2019   MCV 83.6 08/17/2019   PLT 244 08/17/2019   Lab Results  Component Value Date   CREATININE 1.07 08/17/2019   Lab Results  Component Value Date   INR 1.0 08/16/2019   CBG (last 3)  No results for input(s): GLUCAP in the last 72 hours.  PROBLEM LIST:    Active Problems:   PAD (peripheral artery disease) (HCC)   Critical lower limb ischemia   CURRENT MEDS:   . aspirin EC  81 mg Oral Q0600  . Chlorhexidine Gluconate Cloth  6 each Topical Daily  . docusate sodium  100 mg Oral Daily  . heparin  5,000 Units Subcutaneous Q8H  . lisinopril  20 mg Oral Daily  . pantoprazole  40 mg Oral Daily    08/18/2019 Office: [phone] 08/17/2019

## 2019-08-17 NOTE — NC FL2 (Signed)
[place] MEDICAID FL2 LEVEL OF CARE SCREENING TOOL     IDENTIFICATION  Patient Name: Mitchell Richard Birthdate: Jun 03, 1961 Sex: male Admission Date (Current Location): 08/16/2019  [place] and [place] Number:  Mitchell Richard [id number] Facility and Address:  [facility] [place] [facility] [place], [address] [place], [place] [address]      Provider Number: [id number]  Attending Physician Name and Address:  Mitchell Richard, *  Relative Name and Phone Number:  Mitchell Richard sister [phone]    Current Level of Care: Hospital Recommended Level of Care: Skilled Nursing Facility Prior Approval Number:    Date Approved/Denied:   PASRR Number: [id number]  Discharge Plan: SNF    Current Diagnoses: Patient Active Problem List   Diagnosis Date Noted  . PAD (peripheral artery disease) (HCC) 08/16/2019  . Critical lower limb ischemia 08/16/2019  . Hypertension   . 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 Ulcer of right heel, with necrosis of bone (HCC)     Orientation RESPIRATION BLADDER Height & Weight     Self, Time, Situation, Place  Normal Continent, External catheter Weight: 189 lb 13.1 oz (86.1 kg) Height:  6' (182.9 cm)  BEHAVIORAL SYMPTOMS/MOOD NEUROLOGICAL BOWEL NUTRITION STATUS      Continent Diet(See d/c summary)  AMBULATORY STATUS COMMUNICATION OF NEEDS Skin   Extensive Assist Verbally Normal                       Personal Care Assistance Level of Assistance  Bathing, Dressing Bathing Assistance: Limited assistance   Dressing Assistance: Limited assistance     Functional Limitations Info             SPECIAL CARE FACTORS FREQUENCY  PT (By licensed PT), OT (By licensed OT)     PT Frequency: 5x a week OT Frequency: 5x a week            Contractures Contractures Info: Not present    Additional Factors Info  Code Status, Allergies, Isolation Precautions Code Status Info: FULL Allergies Info: NKA     Isolation  Precautions Info: COVID+     Current Medications (08/17/2019):  This is the current hospital active medication list Current Facility-Administered Medications  Medication Dose Route Frequency Provider Last Rate Last Admin  . 0.9 %  sodium chloride infusion  500 mL Intravenous Once PRN Rhyne, Samantha J, PA-C      . 0.9 %  sodium chloride infusion   Intravenous Continuous Rhyne, Ames Coupe, PA-C 75 mL/hr at 08/16/19 1800 Rate Verify at 08/16/19 1800  . acetaminophen (TYLENOL) tablet 325-650 mg  325-650 mg Oral Q4H PRN Rhyne, Samantha J, PA-C       Or  . acetaminophen (TYLENOL) suppository 325-650 mg  325-650 mg Rectal Q4H PRN Rhyne, Samantha J, PA-C      . alum & mag hydroxide-simeth (MAALOX/MYLANTA) 200-200-20 MG/5ML suspension 15-30 mL  15-30 mL Oral Q2H PRN Rhyne, Samantha J, PA-C      . aspirin EC tablet 81 mg  81 mg Oral Q0600 Rhyne, Samantha J, PA-C   81 mg at 08/17/19 0656  . atorvastatin (LIPITOR) tablet 10 mg  10 mg Oral q1800 Mitchell Hint, MD      . bisacodyl (DULCOLAX) suppository 10 mg  10 mg Rectal Daily PRN Rhyne, Ames Coupe, PA-C      . Chlorhexidine Gluconate Cloth 2 % PADS 6 each  6 each Topical Daily Mitchell Hint, MD  6 each at 08/16/19 1328  . docusate sodium (COLACE) capsule 100 mg  100 mg Oral Daily Rhyne, Samantha J, PA-C   100 mg at 08/17/19 0959  . guaiFENesin-dextromethorphan (ROBITUSSIN DM) 100-10 MG/5ML syrup 15 mL  15 mL Oral Q4H PRN Rhyne, Samantha J, PA-C      . heparin injection 5,000 Units  5,000 Units Subcutaneous Q8H Rhyne, Samantha J, PA-C   5,000 Units at 08/17/19 0657  . hydrALAZINE (APRESOLINE) injection 5 mg  5 mg Intravenous Q20 Min PRN Rhyne, Samantha J, PA-C      . labetalol (NORMODYNE) injection 10 mg  10 mg Intravenous Q10 min PRN Rhyne, Samantha J, PA-C      . lisinopril (ZESTRIL) tablet 20 mg  20 mg Oral Daily Rhyne, Samantha J, PA-C   20 mg at 08/17/19 0959  . magnesium sulfate IVPB 2 g 50 mL  2 g Intravenous Daily PRN Rhyne,  Samantha J, PA-C      . metoprolol tartrate (LOPRESSOR) injection 2-5 mg  2-5 mg Intravenous Q2H PRN Rhyne, Samantha J, PA-C      . morphine 2 MG/ML injection 2 mg  2 mg Intravenous Q2H PRN Rhyne, Samantha J, PA-C   2 mg at 08/17/19 0656  . ondansetron (ZOFRAN) injection 4 mg  4 mg Intravenous Q6H PRN Rhyne, Samantha J, PA-C      . oxyCODONE-acetaminophen (PERCOCET/ROXICET) 5-325 MG per tablet 1-2 tablet  1-2 tablet Oral Q4H PRN Rhyne, Hulen Shouts, PA-C   2 tablet at 08/17/19 0959  . pantoprazole (PROTONIX) EC tablet 40 mg  40 mg Oral Daily Rhyne, Hulen Shouts, PA-C   40 mg at 08/17/19 0959  . phenol (CHLORASEPTIC) mouth spray 1 spray  1 spray Mouth/Throat PRN Rhyne, Samantha J, PA-C      . polyethylene glycol (MIRALAX / GLYCOLAX) packet 17 g  17 g Oral Daily PRN Rhyne, Samantha J, PA-C      . potassium chloride SA (KLOR-CON) CR tablet 20-40 mEq  20-40 mEq Oral Daily PRN Rhyne, Hulen Shouts, PA-C         Discharge Medications: Please see discharge summary for a list of discharge medications.  Relevant Imaging Results:  Relevant Lab Results:   Additional Information SSN [id number]  Mitchell Leisinger Richard Riddick, LCSW

## 2019-08-17 NOTE — Evaluation (Signed)
Occupational Therapy Evaluation Patient Details Name: Mitchell Richard MRN: [id number] DOB: August 16, 1960 Today's Date: 08/17/2019    History of Present Illness 59 y.o. male who presented 08/16/19 for RLE bypass graft due to a nonhealing wound on his right Achilles.  He has been followed in the wound care center. He is not diabetic. Found to be COVID + Underwent Rt fem-pop bypass.  PMH-HTN PAD PTSD   Clinical Impression   This 59 y/o male presents with the above. PTA pt reports he is mod independent with ADL and functional mobility using SPC. Pt currently requiring minA for functional transfers, overall modA for LB ADL. Pt limited in safe completion of ADL and mobility given RLE pain, decreased standing balance and mobility status. Pt noted to have impaired cognition including decreased insight into current deficits, requiring cues for safety throughout session. VSS throughout on RA. Pt somewhat unclear as to home setup/situation, reports he typically lives with family but cannot stay with them at time of discharge given Covid+. He will benefit from continued acute OT services, given decreased caregiver support at time of discharge and pt's current deficits recommend follow up therapy in SNF setting to maximize his safety and independence with ADL and mobility. Will follow.     Follow Up Recommendations  SNF;Supervision/Assistance - 24 hour    Equipment Recommendations  Other (comment)(TBD)           Precautions / Restrictions Precautions Precautions: Fall Precaution Comments: painful RLE Restrictions Weight Bearing Restrictions: No      Mobility Bed Mobility Overal bed mobility: Needs Assistance Bed Mobility: Sit to Supine     Supine to sit: Supervision;HOB elevated Sit to supine: Min guard   General bed mobility comments: for safety and lines, close guarding as pt quite close to EOB when returning to supine  Transfers Overall transfer level: Needs assistance Equipment used:  Straight cane Transfers: Sit to/from [employer] Transfers Sit to Stand: Min assist Stand pivot transfers: Min assist       General transfer comment: good power up, min A for steadying once in upright due to increased pain in R LE, VCs for safety with transition to EOB, pt tending to maintain NWB in RLE with transition, use of additional UE support on bedrail with transition     Balance Overall balance assessment: Needs assistance Sitting-balance support: Feet supported;No upper extremity supported Sitting balance-Leahy Scale: Fair     Standing balance support: Bilateral upper extremity supported;Single extremity supported Standing balance-Leahy Scale: Poor Standing balance comment: requires UE assist for static balance                           ADL either performed or assessed with clinical judgement   ADL Overall ADL's : Needs assistance/impaired Eating/Feeding: Modified independent;Sitting   Grooming: Set up;Sitting   Upper Body Bathing: Set up;Min guard;Sitting   Lower Body Bathing: Moderate assistance;Sitting/lateral leans;Sit to/from stand   Upper Body Dressing : Set up;Min guard;Sitting   Lower Body Dressing: Moderate assistance;Sit to/from stand;Sitting/lateral leans   Toilet Transfer: Minimal assistance;Stand-pivot Toilet Transfer Details (indicate cue type and reason): simulated via transfer recliner to EOB Toileting- Clothing Manipulation and Hygiene: Moderate assistance;Sitting/lateral lean;Sit to/from stand       Functional mobility during ADLs: Minimal assistance;Cane(stand pivot transfer) General ADL Comments: pt with limitations due to RLE pain, decreased standing balance, impaired cognition  Pertinent Vitals/Pain Pain Assessment: Faces Faces Pain Scale: Hurts whole lot Pain Location: RLE Pain Descriptors / Indicators: Grimacing;Guarding;Sharp Pain Intervention(s): Limited activity within patient's  tolerance;Monitored during session;Repositioned     Hand Dominance     Extremity/Trunk Assessment Upper Extremity Assessment Upper Extremity Assessment: Overall WFL for tasks assessed   Lower Extremity Assessment Lower Extremity Assessment: Defer to PT evaluation   Cervical / Trunk Assessment Cervical / Trunk Assessment: Normal   Communication Communication Communication: No difficulties   Cognition Arousal/Alertness: Awake/alert Behavior During Therapy: WFL for tasks assessed/performed Overall Cognitive Status: No family/caregiver present to determine baseline cognitive functioning                                 General Comments: pt continues to be bothered by lines/monitors, asking to have them removed, when asked about d/c location agrees he can't go to his sisters because of COVID, decreased safety awareness and decreased insight into current deficits, offering to help therapist to change bed linens but then maintaining NWB in RLE with transitions   General Comments  VSS on RA    Exercises     Shoulder Instructions      Home Living Family/patient expects to be discharged to:: Unsure Living Arrangements: Other relatives(sister, nephew)     Home Access: Stairs to enter Technical brewer of Steps: 5                   Home Equipment: Cane - single point;Walker - 2 wheels;Shower seat - built in          Prior Functioning/Environment Level of Independence: Independent with assistive device(s)        Comments: used cane PTA        OT Problem List: Decreased strength;Decreased range of motion;Decreased activity tolerance;Impaired balance (sitting and/or standing);Decreased cognition;Decreased safety awareness;Decreased knowledge of use of DME or AE;Decreased knowledge of precautions;Cardiopulmonary status limiting activity;Pain      OT Treatment/Interventions: Self-care/ADL training;Therapeutic exercise;Energy conservation;DME and/or  AE instruction;Therapeutic activities;Patient/family education;Balance training;Cognitive remediation/compensation    OT Goals(Current goals can be found in the care plan section) Acute Rehab OT Goals Patient Stated Goal: be able to walk with cane OT Goal Formulation: With patient Time For Goal Achievement: 08/31/19 Potential to Achieve Goals: Good  OT Frequency: Min 2X/week   Barriers to D/C:            Co-evaluation              AM-PAC OT "6 Clicks" Daily Activity     Outcome Measure Help from another person eating meals?: None Help from another person taking care of personal grooming?: A Little Help from another person toileting, which includes using toliet, bedpan, or urinal?: A Lot Help from another person bathing (including washing, rinsing, drying)?: A Lot Help from another person to put on and taking off regular upper body clothing?: None Help from another person to put on and taking off regular lower body clothing?: A Lot 6 Click Score: 17   End of Session Equipment Utilized During Treatment: Other (comment)(cane) Nurse Communication: Mobility status  Activity Tolerance: Patient tolerated treatment well;Patient limited by pain Patient left: in bed;with call bell/phone within reach;with bed alarm set  OT Visit Diagnosis: Other abnormalities of gait and mobility (R26.89);Pain;Other symptoms and signs involving cognitive function Pain - Right/Left: Right Pain - part of body: Leg  Time: 8412-8208 OT Time Calculation (min): 19 min Charges:  OT General Charges $OT Visit: 1 Visit OT Evaluation $OT Eval Moderate Complexity: 1 Mod  Marcy Siren, OT [employer] Pager [phone] Office [phone]  Orlando Penner 08/17/2019, 2:54 PM

## 2019-08-18 ENCOUNTER — Encounter (HOSPITAL_COMMUNITY): Payer: Medicaid Other

## 2019-08-18 LAB — GLUCOSE, CAPILLARY: Glucose-Capillary: 113 mg/dL — ABNORMAL HIGH (ref 70–99)

## 2019-08-18 MED ORDER — PNEUMOCOCCAL VAC POLYVALENT 25 MCG/0.5ML IJ INJ
0.5000 mL | INJECTION | INTRAMUSCULAR | Status: AC
  Administered 2019-08-19: 0.5 mL via INTRAMUSCULAR
  Filled 2019-08-18 (×2): qty 0.5

## 2019-08-18 MED ORDER — INFLUENZA VAC SPLIT QUAD 0.5 ML IM SUSY
0.5000 mL | PREFILLED_SYRINGE | INTRAMUSCULAR | Status: AC
  Administered 2019-08-19: 0.5 mL via INTRAMUSCULAR
  Filled 2019-08-18: qty 0.5

## 2019-08-18 NOTE — Progress Notes (Signed)
   VASCULAR SURGERY ASSESSMENT & PLAN:   POD 2 RIGHT FEMORAL TO ABOVE-KNEE POPLITEAL ARTERY BYPASS (VEIN): His right foot and leg are warm.  Given that he is in Covid isolation unable to assess with Doppler.  He does have a palpable right popliteal pulse  RIGHT ACHILLES WOUND: Continue daily dressing changes.  This was being done by the home health nurse at home.  MOBILITY: Appreciate physical therapy's help yesterday.  They have recommended SNF. FL2 signed.   DVT PROPHYLAXIS: He is on subcu heparin.  VASCULAR QUALITY INITIATIVE: He is on aspirin and a statin.   DISPOSITION: Home when ambulating and pain controlled on po pain medication.   SUBJECTIVE:   Pain adequately controlled  PHYSICAL EXAM:   Vitals:   08/17/19 2110 08/17/19 2250 08/18/19 0523 08/18/19 0552  BP:   101/76 101/76  Pulse: 90   79  Resp: (!) 22 20 20    Temp:    98.2 F (36.8 C)  TempSrc:      SpO2: 95%   93%  Weight:    91.1 kg  Height:       The right foot is warm and well-perfused. He has a palpable right popliteal pulse. His dressing is dry. His incisions look fine.  LABS:   Lab Results  Component Value Date   WBC 11.0 (H) 08/17/2019   HGB 12.5 (L) 08/17/2019   HCT 35.3 (L) 08/17/2019   MCV 83.6 08/17/2019   PLT 244 08/17/2019    PROBLEM LIST:    Active Problems:   PAD (peripheral artery disease) (HCC)   Critical lower limb ischemia   CURRENT MEDS:   . aspirin EC  81 mg Oral Q0600  . atorvastatin  10 mg Oral q1800  . Chlorhexidine Gluconate Cloth  6 each Topical Daily  . docusate sodium  100 mg Oral Daily  . heparin  5,000 Units Subcutaneous Q8H  . [START ON 08/19/2019] influenza vac split quadrivalent PF  0.5 mL Intramuscular Tomorrow-1000  . lisinopril  20 mg Oral Daily  . pantoprazole  40 mg Oral Daily  . [START ON 08/19/2019] pneumococcal 23 valent vaccine  0.5 mL Intramuscular Tomorrow-1000    08/21/2019 Office: [phone] 08/18/2019

## 2019-08-18 NOTE — Progress Notes (Signed)
Physical Therapy Treatment Patient Details Name: Mitchell Richard MRN: [id number] DOB: July 07, 1961 Today's Date: 08/18/2019    History of Present Illness 59 y.o. male who presented 08/16/19 for RLE bypass graft due to a nonhealing wound on his right Achilles.  He has been followed in the wound care center. He is not diabetic. Found to be COVID + Underwent Rt fem-pop bypass.  PMH-HTN PAD PTSD    PT Comments    Patient moves impulsively, quickly and likes to argue about what is good for his healing RLE. Ultimately he did all that I asked of him (ROM to full knee extension, AROM DF to -10 degrees, using RLE during gait). Required incr time and explanations for all aspects of treatment.    Follow Up Recommendations  SNF     Equipment Recommendations  None recommended by PT    Recommendations for Other Services       Precautions / Restrictions Precautions Precautions: Fall Precaution Comments: painful RLE Restrictions Weight Bearing Restrictions: No    Mobility  Bed Mobility Overal bed mobility: Needs Assistance Bed Mobility: Sit to Supine;Supine to Sit     Supine to sit: Supervision;HOB elevated Sit to supine: Min guard   General bed mobility comments: for safety and lines, close guarding as pt quite close to EOB when returning to supine with near need for assist to raise RLE onto bed  Transfers Overall transfer level: Needs assistance Equipment used: Straight cane;None;Rolling walker (2 wheeled) Transfers: Sit to/from Stand Sit to Stand: Min assist         General transfer comment: good power up, min A for steadying once in upright due to increased pain in R LE with poor weightbearing, VCs for safety with RW vs cane  Ambulation/Gait Ambulation/Gait assistance: Min assist Gait Distance (Feet): 40 Feet Assistive device: Rolling walker (2 wheeled);Straight cane Gait Pattern/deviations: Step-to pattern;Decreased weight shift to right;Decreased stance time - right;Wide base  of support Gait velocity: too fast to be safe   General Gait Details: incr time and cues to get pt to place rt foot on the floor; then incr time/cues for pt to attempt to keep Rt foot on floor as he step left foot even with rt foot (this was after supine exercises for incr ROM RLE)   Stairs             Wheelchair Mobility    Modified Rankin (Stroke Patients Only)       Balance Overall balance assessment: Needs assistance Sitting-balance support: Feet supported;No upper extremity supported Sitting balance-Leahy Scale: Fair     Standing balance support: Bilateral upper extremity supported;Single extremity supported Standing balance-Leahy Scale: Poor Standing balance comment: requires UE assist for static balance due to inability to get rt foot flat/bear weight on RLE                            Cognition Arousal/Alertness: Awake/alert Behavior During Therapy: WFL for tasks assessed/performed Overall Cognitive Status: No family/caregiver present to determine baseline cognitive functioning                                 General Comments: pt very restless/impulsive; must be stearn to get him to STOP and LISTEN for instructions to improve his safety      Exercises General Exercises - Lower Extremity Ankle Circles/Pumps: AROM;AAROM;Right;20 reps;Supine(attempted mild stretch at end DF with pt pulling his foot  aw) Quad Sets: AROM;Right;10 reps;Supine(5 sec hold) Heel Slides: AROM;Right;10 reps Other Exercises Other Exercises: bridging with emphasis on equal weight on both legs; pt able to do this well and hold for 5 sec    General Comments General comments (skin integrity, edema, etc.): All standing walking done wiht his ortho shoe on rt foot (he was using PTA and had in his closet) and sneaker on his left. In standing with RW, worked on sliding rt foot/leg back underneath him while keep foot flat on the floor and while increasing wt-bearing on RLE;  max cues to move more slowly and for prolonged hold/stretch (at most got pt to hold for ~5 seconds and then he shifts off RLE and pushes RLE out in front of him.       Pertinent Vitals/Pain Pain Assessment: Faces Faces Pain Scale: Hurts whole lot Pain Location: RLE Pain Descriptors / Indicators: Grimacing;Guarding;Sharp Pain Intervention(s): Limited activity within patient's tolerance;Monitored during session;Repositioned;Patient requesting pain meds-RN notified;Premedicated before session    Home Living                      Prior Function            PT Goals (current goals can now be found in the care plan section) Acute Rehab PT Goals Patient Stated Goal: be able to walk with cane PT Goal Formulation: With patient Time For Goal Achievement: 08/30/19 Potential to Achieve Goals: Good Progress towards PT goals: (Goals set (not previously set in error))    Frequency    Min 3X/week      PT Plan Current plan remains appropriate    Co-evaluation              AM-PAC PT "6 Clicks" Mobility   Outcome Measure  Help needed turning from your back to your side while in a flat bed without using bedrails?: A Little Help needed moving from lying on your back to sitting on the side of a flat bed without using bedrails?: A Little Help needed moving to and from a bed to a chair (including a wheelchair)?: A Little Help needed standing up from a chair using your arms (e.g., wheelchair or bedside chair)?: A Little Help needed to walk in hospital room?: A Lot Help needed climbing 3-5 steps with a railing? : Total 6 Click Score: 15    End of Session Equipment Utilized During Treatment: Gait belt Activity Tolerance: Patient limited by pain Patient left: in bed;with call bell/phone within reach;with bed alarm set Nurse Communication: Other (comment)(RN reported pt puts himself back to bed unattended; pt in be) PT Visit Diagnosis: Other abnormalities of gait and mobility  (R26.89);Pain Pain - Right/Left: Right Pain - part of body: Leg     Time: 2263-3354 PT Time Calculation (min) (ACUTE ONLY): 32 min  Charges:  $Gait Training: 8-22 mins $Therapeutic Exercise: 8-22 mins                      Jerolyn Center, PT Pager [phone]    Zena Amos 08/18/2019, 10:57 AM

## 2019-08-19 NOTE — Plan of Care (Signed)
Pt alert and oriented. Able to express needs. Pain 9/10 throbbing right foot constant. PRN oxy given med  Continent of bowel and bladder uses urinal. Needs x 1 assist for mobility with Natalynn Pedone. Able to turn self in bed for repositioning. Appetite adequate. Plan to d/c to SNF/Rehab. Staff will continue to monitor

## 2019-08-19 NOTE — TOC Progression Note (Signed)
Transition of Care [facility]) - Progression Note    Patient Details  Name: Mitchell Richard MRN: [id number] Date of Birth: 1960/07/31  Transition of Care [facility] CM/SW Contact  Mearl Latin, LCSW Phone Number: 08/19/2019, 9:20 AM  Clinical Narrative:    No SNF bed offers due to lack of payor source. CSW continuing search.    Expected Discharge Plan: Skilled Nursing Facility Barriers to Discharge: Continued Medical Work up, SNF Pending bed offer  Expected Discharge Plan and Services Expected Discharge Plan: Skilled Nursing Facility In-house Referral: Clinical Social Work   Post Acute Care Choice: Skilled Nursing Facility Living arrangements for the past 2 months: Single Family Home                                       Social Determinants of Health (SDOH) Interventions    Readmission Risk Interventions No flowsheet data found.

## 2019-08-19 NOTE — Progress Notes (Signed)
Occupational Therapy Treatment Patient Details Name: Mitchell Richard MRN: [id number] DOB: June 18, 1961 Today's Date: 08/19/2019    History of present illness 59 y.o. male who presented 08/16/19 for RLE bypass graft due to a nonhealing wound on his right Achilles.  He has been followed in the wound care center. He is not diabetic. Found to be COVID + Underwent Rt fem-pop bypass.  PMH-HTN PAD PTSD   OT comments  Pt making steady progress towards OT goals this session. Session focus on functional mobility with cane and LB dressing. Overall, pt requires supervision- set- up for functional mobility with cane and set- up for LB dressing. Able to don shoe and darco shoe with set- up assist from EOB. Pt slightly impulsive with movement needing cues for safety, but pt able to walk to nursing station and carry cup of coffee back to room with no spillage. DC plan currently remains appropriate. Will follow acutely per POC.    Follow Up Recommendations  SNF;Supervision/Assistance - 24 hour    Equipment Recommendations  Other (comment)(TBD)    Recommendations for Other Services      Precautions / Restrictions Precautions Precautions: Fall Precaution Comments: painful RLE Restrictions Weight Bearing Restrictions: No       Mobility Bed Mobility Overal bed mobility: Modified Independent Bed Mobility: Sit to Supine;Supine to Sit     Supine to sit: Modified independent (Device/Increase time) Sit to supine: Modified independent (Device/Increase time)   General bed mobility comments: no physical assist needed, use of bed rails with HOB elevated  Transfers Overall transfer level: Needs assistance Equipment used: Straight cane;None Transfers: Sit to/from Stand Sit to Stand: Supervision         General transfer comment: able to sit>stand from EOB with no AD with no physical assist, cues for safety    Balance Overall balance assessment: Needs assistance Sitting-balance support: Feet supported;No  upper extremity supported Sitting balance-Leahy Scale: Good Sitting balance - Comments: able to don shoes sitting EOB   Standing balance support: No upper extremity supported;Single extremity supported Standing balance-Leahy Scale: Fair Standing balance comment: able to stand with no UE support while fixing coffee                           ADL either performed or assessed with clinical judgement   ADL Overall ADL's : Needs assistance/impaired                 Upper Body Dressing : Set up;Sitting Upper Body Dressing Details (indicate cue type and reason): able to don back side gown with set- up assist from EOB Lower Body Dressing: Set up;Sitting/lateral leans Lower Body Dressing Details (indicate cue type and reason): able to don shoes with set- up Toilet Transfer: Supervision/safety;Ambulation;Cueing for safety Toilet Transfer Details (indicate cue type and reason): simulated via functional mobility with cane with supervision; pt slightly impulsive with movement needing cues for safety         Functional mobility during ADLs: Supervision/safety;Cueing for safety;Cane General ADL Comments: pt able to complete LB ADLs with set- up assist and completes functional mobility greater than a household distance with cane and supervision     Vision       Perception     Praxis      Cognition Arousal/Alertness: Awake/alert Behavior During Therapy: [facility] for tasks assessed/performed;Impulsive Overall Cognitive Status: No family/caregiver present to determine baseline cognitive functioning  General Comments: impulsive but likely at baseline, poor safety awareness but overall [facility]        Exercises   Shoulder Instructions       General Comments     Pertinent Vitals/ Pain       Pain Assessment: Faces Pain Score: 6  Faces Pain Scale: Hurts a little bit Pain Location: reaching to BLEs Pain Descriptors / Indicators:  Grimacing;Guarding;Sharp Pain Intervention(s): Limited activity within patient's tolerance;Monitored during session;Repositioned  Home Living                                          Prior Functioning/Environment              Frequency  Min 2X/week        Progress Toward Goals  OT Goals(current goals can now be found in the care plan section)  Progress towards OT goals: Progressing toward goals  Acute Rehab OT Goals Patient Stated Goal: be able to walk with cane OT Goal Formulation: With patient Time For Goal Achievement: 08/31/19 Potential to Achieve Goals: Good  Plan Discharge plan remains appropriate    Co-evaluation                 AM-PAC OT "6 Clicks" Daily Activity     Outcome Measure   Help from another person eating meals?: None Help from another person taking care of personal grooming?: A Little Help from another person toileting, which includes using toliet, bedpan, or urinal?: A Lot Help from another person bathing (including washing, rinsing, drying)?: A Lot Help from another person to put on and taking off regular upper body clothing?: None Help from another person to put on and taking off regular lower body clothing?: A Lot 6 Click Score: 17    End of Session Equipment Utilized During Treatment: Other (comment)(cane; darco shoe)  OT Visit Diagnosis: Other abnormalities of gait and mobility (R26.89);Pain;Other symptoms and signs involving cognitive function Pain - Right/Left: Right Pain - part of body: Leg   Activity Tolerance Patient tolerated treatment well   Patient Left in bed;with call bell/phone within reach;with bed alarm set   Nurse Communication Mobility status        Time: 1430-1455 OT Time Calculation (min): 25 min  Charges: OT General Charges $OT Visit: 1 Visit OT Treatments $Self Care/Home Management : 8-22 mins $Therapeutic Activity: 8-22 mins  Lanier Clam., COTA/L Acute Rehabilitation  Services [phone]    Ihor Gully 08/19/2019, 4:12 PM

## 2019-08-19 NOTE — Progress Notes (Signed)
Physical Therapy Treatment Patient Details Name: Mitchell Richard MRN: [id number] DOB: 01/04/1961 Today's Date: 08/19/2019    History of Present Illness 59 y.o. male who presented 08/16/19 for RLE bypass graft due to a nonhealing wound on his right Achilles.  He has been followed in the wound care center. He is not diabetic. Found to be COVID + Underwent Rt fem-pop bypass.  PMH-HTN PAD PTSD    PT Comments    Patient progressing with ambulation tolerance with increased distance this setting.  Still demonstrates gait abnormality with high risk for falls as stubborn on his method, though corrected when I asked him to, likely to self select less safe techniques.  Continue to feel he is appropriate for SNF level rehab at dc.   Follow Up Recommendations  SNF     Equipment Recommendations  None recommended by PT    Recommendations for Other Services       Precautions / Restrictions Precautions Precautions: Fall Precaution Comments: painful RLE    Mobility  Bed Mobility Overal bed mobility: Modified Independent             General bed mobility comments: slow to return to supine but no help needed  Transfers   Equipment used: Straight cane;None;Rolling walker (2 wheeled) Transfers: Sit to/from Stand Sit to Stand: Supervision         General transfer comment: stands up unaided, touching surfaces in standing for balance till getting a cane; pt donned shoes in sitting  Ambulation/Gait Ambulation/Gait assistance: Min guard;Min assist Gait Distance (Feet): 150 Feet(&40' with RW) Assistive device: Straight cane Gait Pattern/deviations: Step-to pattern;Decreased stance time - right;Wide base of support     General Gait Details: used cane for hallway ambulation and demonstrated R hip external rotation, pelvic retraction and step to sequence; began to complain of back pain half way through distance, so minguard provided for safety/support, then began to c/o LLE pain on posterior  aspect, reports h/o of this type of pain due to his back (similar to sciatica); walked in room with RW with improved symmetry and less bak strain   Chief Strategy Officer    Modified Rankin (Stroke Patients Only)       Balance Overall balance assessment: Needs assistance Sitting-balance support: Feet supported;No upper extremity supported Sitting balance-Leahy Scale: Good Sitting balance - Comments: able to don shoes sitting EOB   Standing balance support: No upper extremity supported;Single extremity supported Standing balance-Leahy Scale: Fair Standing balance comment: stood without UE support but weight shifted on L LE, then held sink while I obtained his cane                            Cognition Arousal/Alertness: Awake/alert Behavior During Therapy: WFL for tasks assessed/performed Overall Cognitive Status: No family/caregiver present to determine baseline cognitive functioning                                 General Comments: likely at his baseline though repeats questions and wanst clarification for each task assisgned      Exercises Other Exercises Other Exercises: sitting R knee flexion with foot flat for ankle DF x 10    General Comments General comments (skin integrity, edema, etc.): noted functional LLD due to taller post op shoe than his tennis shoe, but patient reports some curve in  his spine and may have LLD even with same shoes on.  Educated him to go through his primary MD if he wants surgical referral for his back as reports did PT about 6 months at the [place] without improvement.      Pertinent Vitals/Pain Pain Assessment: 0-10 Pain Score: 6  Pain Location: RLE; with ambulation L LE posterior leg pain Pain Descriptors / Indicators: Grimacing;Guarding;Sharp Pain Intervention(s): Monitored during session;Repositioned    Home Living                      Prior Function            PT Goals (current  goals can now be found in the care plan section) Progress towards PT goals: Progressing toward goals    Frequency    Min 3X/week      PT Plan Current plan remains appropriate    Co-evaluation              AM-PAC PT "6 Clicks" Mobility   Outcome Measure  Help needed turning from your back to your side while in a flat bed without using bedrails?: A Little Help needed moving from lying on your back to sitting on the side of a flat bed without using bedrails?: A Little Help needed moving to and from a bed to a chair (including a wheelchair)?: A Little Help needed standing up from a chair using your arms (e.g., wheelchair or bedside chair)?: A Little Help needed to walk in hospital room?: A Little Help needed climbing 3-5 steps with a railing? : A Lot 6 Click Score: 17    End of Session   Activity Tolerance: Patient tolerated treatment well Patient left: in bed;with call bell/phone within reach;with bed alarm set   PT Visit Diagnosis: Other abnormalities of gait and mobility (R26.89);Pain Pain - Right/Left: Right Pain - part of body: Leg     Time: 9798-9211 PT Time Calculation (min) (ACUTE ONLY): 35 min  Charges:  $Gait Training: 8-22 mins $Therapeutic Activity: 8-22 mins                     Sheran Lawless, [facility] [phone] 08/19/2019    [name] 08/19/2019, 2:35 PM

## 2019-08-19 NOTE — Progress Notes (Signed)
   VASCULAR SURGERY ASSESSMENT & PLAN:   POD 3RIGHT FEMORAL TO ABOVE-KNEE POPLITEAL ARTERY BYPASS (VEIN): His right foot and leg are warm. Given that he is in Covid isolation unable to assess with Doppler.  He does have a palpable right popliteal pulse  RIGHT ACHILLES WOUND: Continue daily dressing changes. This was being done by the home health nurse at home.  MOBILITY:Appreciate physical therapy's. They have recommended SNF. FL2 signed.   DVT PROPHYLAXIS: He is on subcu heparin.  VASCULAR QUALITY INITIATIVE:He is on aspirin and a statin.   DISPOSITION: Home when ambulating and pain controlled onpopain medication.   SUBJECTIVE:   Pain adequately controlled.   PHYSICAL EXAM:   Vitals:   08/18/19 0850 08/18/19 1700 08/18/19 2241 08/19/19 0523  BP: 133/80 133/80 115/73 112/74  Pulse: 71 65  (!) 56  Resp: 16 19 19    Temp: 97.6 F (36.4 C) 98 F (36.7 C) 98.6 F (37 C) 98.6 F (37 C)  TempSrc: Oral Oral Oral Oral  SpO2: 97% 94%  98%  Weight:      Height:         LABS:   Lab Results  Component Value Date   WBC 11.0 (H) 08/17/2019   HGB 12.5 (L) 08/17/2019   HCT 35.3 (L) 08/17/2019   MCV 83.6 08/17/2019   PLT 244 08/17/2019   Lab Results  Component Value Date   CREATININE 1.07 08/17/2019   Lab Results  Component Value Date   INR 1.0 08/16/2019   CBG (last 3)  Recent Labs    08/18/19 2053  GLUCAP 113*    PROBLEM LIST:    Active Problems:   PAD (peripheral artery disease) (HCC)   Critical lower limb ischemia   CURRENT MEDS:   . aspirin EC  81 mg Oral Q0600  . atorvastatin  10 mg Oral q1800  . Chlorhexidine Gluconate Cloth  6 each Topical Daily  . docusate sodium  100 mg Oral Daily  . heparin  5,000 Units Subcutaneous Q8H  . lisinopril  20 mg Oral Daily  . pantoprazole  40 mg Oral Daily  . pneumococcal 23 valent vaccine  0.5 mL Intramuscular Tomorrow-1000    2054 Office: [phone] 08/19/2019

## 2019-08-20 NOTE — Progress Notes (Signed)
  Progress Note    08/20/2019 8:34 AM 4 Days Post-Op  Subjective: No overnight issues.  Vitals:   08/19/19 2219 08/20/19 0537  BP: 134/85 130/86  Pulse: 84 62  Resp: 20   Temp: 98.3 F (36.8 C) 98.5 F (36.9 C)  SpO2: 100% 98%    Physical Exam: Awake alert lady Nonlabored respirations Right lower extremity incisions clean dry intact Right foot incision with dressing in place Palpable right popliteal pulse  CBC    Component Value Date/Time   WBC 11.0 (H) 08/17/2019 0245   RBC 4.22 08/17/2019 0245   HGB 12.5 (L) 08/17/2019 0245   HCT 35.3 (L) 08/17/2019 0245   PLT 244 08/17/2019 0245   MCV 83.6 08/17/2019 0245   MCH 29.6 08/17/2019 0245   MCHC 35.4 08/17/2019 0245   RDW 14.6 08/17/2019 0245   LYMPHSABS 1.4 04/20/2019 2300   MONOABS 0.5 04/20/2019 2300   EOSABS 0.1 04/20/2019 2300   BASOSABS 0.0 04/20/2019 2300    BMET    Component Value Date/Time   NA 139 08/17/2019 0245   K 3.1 (L) 08/17/2019 0245   CL 106 08/17/2019 0245   CO2 24 08/17/2019 0245   GLUCOSE 121 (H) 08/17/2019 0245   BUN 9 08/17/2019 0245   CREATININE 1.07 08/17/2019 0245   CALCIUM 8.3 (L) 08/17/2019 0245   GFRNONAA >60 08/17/2019 0245   GFRAA >60 08/17/2019 0245    INR    Component Value Date/Time   INR 1.0 08/16/2019 0630     Intake/Output Summary (Last 24 hours) at 08/20/2019 0834 Last data filed at 08/20/2019 0538 Gross per 24 hour  Intake 1440 ml  Output 2551 ml  Net -1111 ml     Assessment/plan:  59 y.o. male is s/p common femoral to above-knee carotid artery bypass with vein stripping neurovascularly event.  Overall progressing well.  Plan will be for SNF when bed available.   Lillionna Nabi C. Randie Heinz, MD [facility] Office: [phone] Pager: [phone]  08/20/2019 8:34 AM

## 2019-08-21 MED ORDER — ASPIRIN 81 MG PO TBEC: 81.0000 mg | DELAYED_RELEASE_TABLET | Freq: Every day | ORAL | Status: DC

## 2019-08-21 MED ORDER — OXYCODONE-ACETAMINOPHEN 5-325 MG PO TABS: 1.0000 | ORAL_TABLET | Freq: Four times a day (QID) | ORAL | 0 refills | Status: DC | PRN

## 2019-08-21 NOTE — Discharge Summary (Signed)
Bypass Discharge Summary Patient ID: Nikash Mortensen [id number] 59 y.o. 30-Aug-1960  Admit date: 08/16/2019  Discharge date and time: 08/21/19   Admitting Physician: Chuck Hint, MD   Discharge Physician: Dr. Randie Heinz  Admission Diagnoses: PAD (peripheral artery disease) (HCC) [I73.9] Critical lower limb ischemia [I99.8] COVID 19  Discharge Diagnoses: same  Admission Condition: fair  Discharged Condition: fair  Indication for Admission: Critical limb ischemia right lower extremity  Hospital Course: Mr. Candy Ziegler is a 59 year old male who came in as an outpatient on 08/16/2019 for scheduled bypass surgery of right lower extremity.  In preoperative holding he tested positive for COVID-19 however due to critical limb ischemia with nonhealing wounds of right foot we proceeded with right common femoral to above-the-knee popliteal artery bypass with vein graft by Dr. Edilia Bo.  He tolerated the procedure well and was admitted to the Covid stepdown unit.  Throughout his hospital stay he has been perfusing his right foot has a palpable popliteal pulse.  Wound care of Achilles wound was continued with wet-to-dry dressing changes.  He was evaluated by PT/OT who recommended skilled nursing facility placement.  Social worker was consulted for SNF placement however this process took longer than usual due to [employer] and Covid 19 diagnosis.  At the time of discharge right foot was well-perfused.  He will be prescribed 2 to 3 days of narcotic pain medication for continued postoperative pain control.  He will continue aspirin and statin daily.  He will follow-up in about 3 weeks with Dr. Edilia Bo.  Addendum:  Plan is to discharge home today.  [facility] social [facility] arranged SNF placement with an available bed tomorrow which he will be admitted to from home.  Consults: None  Treatments: surgery: Right common femoral to above-the-knee popliteal artery bypass with nonreversed saphenous vein graft  and intraoperative arteriogram by Dr. Edilia Bo on 08/16/2019   Disposition: Discharge disposition: 01-Home or Self Care      home  - For VQI Registry use ---  Post-op:  Wound infection: No  Graft infection: No  Transfusion: No   New Arrhythmia: No D/C Ambulatory Status: Ambulatory with Assistance  Complications: MI: [ x] No, [ ]  Troponin only, [ ]  EKG or Clinical CHF: No Resp failure: [x ] none, [ ]  Pneumonia, [ ]  Ventilator Chg in renal function: [x ] none, [ ]  Inc. Cr > 0.5, [ ]  Temp. Dialysis, [ ]  Permanent dialysis Stroke: [x ] None, [ ]  Minor, [ ]  Major Return to OR: No  Reason for return to OR: [ ]  Bleeding, [ ]  Infection, [ ]  Thrombosis, [ ]  Revision  Discharge medications: Statin use:  Yes ASA use:  Yes Plavix use:  No  for medical reason Not indicated Beta blocker use: No  for medical reason Not indicated Coumadin use: No  for medical reason Not indicated    Patient Instructions:  Allergies as of 08/21/2019   No Known Allergies     Medication List    TAKE these medications   aspirin 81 MG EC tablet Take 1 tablet (81 mg total) by mouth daily at 6 (six) AM. Start taking on: August 22, 2019   atorvastatin 10 MG tablet Commonly known as: Lipitor Take 1 tablet (10 mg total) by mouth daily.   ibuprofen 800 MG tablet Commonly known as: ADVIL Take 1 tablet (800 mg total) by mouth every 8 (eight) hours as needed.   lisinopril 20 MG tablet Commonly known as: ZESTRIL Take 20 mg by mouth daily.  oxyCODONE-acetaminophen 5-325 MG tablet Commonly known as: PERCOCET/ROXICET Take 1 tablet by mouth every 6 (six) hours as needed for moderate pain.      Activity: activity as tolerated Diet: regular diet Wound Care: Continue current wound care for Achilles wound with wet-to-dry dressing changes  Follow-up with Dr. Scot Dock in 3 weeks.  SignedDagoberto Ligas 08/21/2019 12:52 PM

## 2019-08-21 NOTE — Plan of Care (Signed)
  Problem: Education: Goal: Knowledge of General Education information will improve Description: Including pain rating scale, medication(s)/side effects and non-pharmacologic comfort measures Outcome: Completed/Met   Problem: Health Behavior/Discharge Planning: Goal: Ability to manage health-related needs will improve Outcome: Completed/Met   Problem: Clinical Measurements: Goal: Ability to maintain clinical measurements within normal limits will improve Outcome: Completed/Met   Problem: Clinical Measurements: Goal: Will remain free from infection Outcome: Completed/Met   Problem: Clinical Measurements: Goal: Diagnostic test results will improve Outcome: Completed/Met   Problem: Clinical Measurements: Goal: Respiratory complications will improve Outcome: Completed/Met   Problem: Clinical Measurements: Goal: Cardiovascular complication will be avoided Outcome: Completed/Met

## 2019-08-21 NOTE — Progress Notes (Signed)
Nsg Discharge Note  Admit Date:  08/16/2019 Discharge date: 08/21/2019   Mitchell Richard to be D/C'd Home\ per MD order.  AVS completed.  Copy for chart, and copy for patient signed, and dated. Patient/caregiver able to verbalize understanding.  Discharge Medication: Allergies as of 08/21/2019   No Known Allergies     Medication List    TAKE these medications   aspirin 81 MG EC tablet Take 1 tablet (81 mg total) by mouth daily at 6 (six) AM. Start taking on: August 22, 2019   atorvastatin 10 MG tablet Commonly known as: Lipitor Take 1 tablet (10 mg total) by mouth daily.   ibuprofen 800 MG tablet Commonly known as: ADVIL Take 1 tablet (800 mg total) by mouth every 8 (eight) hours as needed.   lisinopril 20 MG tablet Commonly known as: ZESTRIL Take 20 mg by mouth daily.   oxyCODONE-acetaminophen 5-325 MG tablet Commonly known as: PERCOCET/ROXICET Take 1 tablet by mouth every 6 (six) hours as needed for moderate pain.       Discharge Assessment: Vitals:   08/21/19 0433 08/21/19 1228  BP: 135/87 (!) 137/95  Pulse: (!) 56 (!) 58  Resp: 18 19  Temp: 98.4 F (36.9 C) 98 F (36.7 C)  SpO2: 98% 100%   Skin clean, dry and intact without evidence of skin break down, no evidence of skin tears noted. IV catheter discontinued intact. Site without signs and symptoms of complications - no redness or edema noted at insertion site, patient denies c/o pain - only slight tenderness at site.  Dressing with slight pressure applied.  D/c Instructions-Education: Discharge instructions given to patient/family with verbalized understanding. D/c education completed with patient/family including follow up instructions, medication list, d/c activities limitations if indicated, with other d/c instructions as indicated by MD - patient able to verbalize understanding, all questions fully answered. Patient instructed to return to ED, call 911, or call MD for any changes in condition.  Patient  escorted via WC, and D/C home via private auto.  Delphia Grates, RN 08/21/2019 1:21 PM

## 2019-08-21 NOTE — TOC Progression Note (Signed)
Transition of Care [facility]) - Progression Note    Patient Details  Name: Mitchell Richard MRN: [id number] Date of Birth: May 26, 1961  [facility]) CM/SW Contact  Gildardo Griffes, [place] Phone Number: [phone] 08/21/2019, 10:01 AM  Clinical Narrative:     CSW has followed up with [facility], [place], [facility], and lvm with [facility] on bed offers. Pending feedback at this time.   Expected Discharge Plan: Skilled Nursing Facility Barriers to Discharge: Continued Medical Work up, SNF Pending bed offer  Expected Discharge Plan and Services Expected Discharge Plan: Skilled Nursing Facility In-house Referral: Clinical Social Work   Post Acute Care Choice: Skilled Nursing Facility Living arrangements for the past 2 months: Single Family Home                                       Social Determinants of Health (SDOH) Interventions    Readmission Risk Interventions No flowsheet data found.

## 2019-08-21 NOTE — TOC Progression Note (Signed)
Transition of Care [facility]) - Progression Note    Patient Details  Name: Mitchell Richard MRN: [id number] Date of Birth: 05/07/1961  Transition of Care [name]) CM/SW Contact  Gildardo Griffes, [place] Phone Number: [phone] 08/21/2019, 12:20 PM  Clinical Narrative:     CSW spoke with patient to inform of no bed offers, he reports he has a back up plan. He states he would like to discharge home today with his sister who will pick him up. He reports he will go to the [place] service center tomorrow and has been working with the Director of Housing there [employer] who has a bed waiting for him. She can be reached at [phone] Ext [phone] and the address he will go to tomorrow is [address]. CSW inquired as to how patient was getting around with his mobility and he reports he was making his bed everyday and moving around a lot better than before.   CSW informed PA of patient discharge plan and that his sister will pick him up today, PA in agreement with dc plan.   Expected Discharge Plan: Skilled Nursing Facility Barriers to Discharge: Continued Medical Work up, SNF Pending bed offer  Expected Discharge Plan and Services Expected Discharge Plan: Skilled Nursing Facility In-house Referral: Clinical Social Work   Post Acute Care Choice: Skilled Nursing Facility Living arrangements for the past 2 months: Single Family Home                                       Social Determinants of Health (SDOH) Interventions    Readmission Risk Interventions No flowsheet data found.

## 2019-08-21 NOTE — Progress Notes (Signed)
  Progress Note    08/21/2019 10:41 AM 5 Days Post-Op  Subjective: No overnight issues, patient states that he is up walking without issue  Vitals:   08/20/19 2022 08/21/19 0433  BP: 127/72 135/87  Pulse: 63 (!) 56  Resp: (!) 21 18  Temp: 98.3 F (36.8 C) 98.4 F (36.9 C)  SpO2: 100% 98%    Physical Exam: Awake alert oriented Right lower extremity incisions healing well with Dermabond in place Palpable right popliteal pulse Dressing on right foot clean dry intact  CBC    Component Value Date/Time   WBC 11.0 (H) 08/17/2019 0245   RBC 4.22 08/17/2019 0245   HGB 12.5 (L) 08/17/2019 0245   HCT 35.3 (L) 08/17/2019 0245   PLT 244 08/17/2019 0245   MCV 83.6 08/17/2019 0245   MCH 29.6 08/17/2019 0245   MCHC 35.4 08/17/2019 0245   RDW 14.6 08/17/2019 0245   LYMPHSABS 1.4 04/20/2019 2300   MONOABS 0.5 04/20/2019 2300   EOSABS 0.1 04/20/2019 2300   BASOSABS 0.0 04/20/2019 2300    BMET    Component Value Date/Time   NA 139 08/17/2019 0245   K 3.1 (L) 08/17/2019 0245   CL 106 08/17/2019 0245   CO2 24 08/17/2019 0245   GLUCOSE 121 (H) 08/17/2019 0245   BUN 9 08/17/2019 0245   CREATININE 1.07 08/17/2019 0245   CALCIUM 8.3 (L) 08/17/2019 0245   GFRNONAA >60 08/17/2019 0245   GFRAA >60 08/17/2019 0245    INR    Component Value Date/Time   INR 1.0 08/16/2019 0630     Intake/Output Summary (Last 24 hours) at 08/21/2019 1041 Last data filed at 08/21/2019 0900 Gross per 24 hour  Intake 1440 ml  Output 850 ml  Net 590 ml     Assessment/plan:  59 y.o. male is status post right femoral to above-knee popliteal bypass with vein for wound.  Plan will be for discharge to SNF when bed available.    Brandon C. Randie Heinz, MD [facility] [place] [facility] [place] Office: [phone] Pager: [phone]  08/21/2019 10:41 AM

## 2019-08-21 NOTE — Plan of Care (Signed)
Dressing change completed. Pt complains of numbness to right extremities. Extremity is warm to touch. Dorsalis pedis pulse present and palpable. Pt says leg feel the same as it did before his surgery with the numbness and throbbing pain. Given prn pain medication as order. Will continue to monitor.

## 2019-08-23 NOTE — Anesthesia Postprocedure Evaluation (Signed)
Anesthesia Post Note  Patient: Mitchell Richard  Procedure(s) Performed: BYPASS GRAFT, right common FEMORAL artery to ABOVE KNEE POPLITEAL ARTERY using right non-reversed greater saphenous vein (Right Leg Upper) Intra Operative Arteriogram, right lower extremity (Right Leg Lower)     Patient location during evaluation: PACU Anesthesia Type: General Level of consciousness: awake and alert Pain management: pain level controlled Vital Signs Assessment: post-procedure vital signs reviewed and stable Respiratory status: spontaneous breathing, nonlabored ventilation, respiratory function stable and patient connected to nasal cannula oxygen Cardiovascular status: blood pressure returned to baseline and stable Postop Assessment: no apparent nausea or vomiting Anesthetic complications: no    Last Vitals:  Vitals:   08/21/19 0433 08/21/19 1228  BP: 135/87 (!) 137/95  Pulse: (!) 56 (!) 58  Resp: 18 19  Temp: 36.9 C 36.7 C  SpO2: 98% 100%    Last Pain:  Vitals:   08/22/19 1237  TempSrc:   PainSc: 0-No pain                 [name]

## 2019-08-24 ENCOUNTER — Encounter: Payer: Self-pay | Admitting: *Deleted
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
## 2019-08-31 ENCOUNTER — Encounter: Payer: Self-pay | Admitting: Cardiovascular Disease

## 2019-08-31 ENCOUNTER — Other Ambulatory Visit: Payer: Self-pay

## 2019-08-31 ENCOUNTER — Ambulatory Visit (INDEPENDENT_AMBULATORY_CARE_PROVIDER_SITE_OTHER): Payer: Medicaid Other | Admitting: Cardiovascular Disease

## 2019-08-31 VITALS — BP 130/80 | HR 99 | Temp 97.7°F | Ht 72.0 in | Wt 193.0 lb

## 2019-08-31 DIAGNOSIS — I2510 Atherosclerotic heart disease of native coronary artery without angina pectoris: Secondary | ICD-10-CM | POA: Diagnosis not present

## 2019-08-31 DIAGNOSIS — Z720 Tobacco use: Secondary | ICD-10-CM | POA: Diagnosis not present

## 2019-08-31 DIAGNOSIS — I10 Essential (primary) hypertension: Secondary | ICD-10-CM

## 2019-08-31 DIAGNOSIS — Z7901 Long term (current) use of anticoagulants: Secondary | ICD-10-CM

## 2019-08-31 DIAGNOSIS — I428 Other cardiomyopathies: Secondary | ICD-10-CM

## 2019-08-31 MED ORDER — LISINOPRIL 20 MG PO TABS: 20.0000 mg | ORAL_TABLET | Freq: Every day | ORAL | 3 refills | Status: AC

## 2019-08-31 MED ORDER — ASPIRIN 81 MG PO TBEC: 81.0000 mg | DELAYED_RELEASE_TABLET | Freq: Every day | ORAL | 1 refills | Status: DC

## 2019-08-31 MED ORDER — ATORVASTATIN CALCIUM 10 MG PO TABS: 10.0000 mg | ORAL_TABLET | Freq: Every day | ORAL | 3 refills | Status: AC

## 2019-08-31 MED ORDER — RIVAROXABAN 20 MG PO TABS: 20.0000 mg | ORAL_TABLET | Freq: Every day | ORAL | 0 refills | Status: DC

## 2019-08-31 MED ORDER — RIVAROXABAN 20 MG PO TABS: 20.0000 mg | ORAL_TABLET | Freq: Every day | ORAL | 0 refills | Status: AC

## 2019-08-31 MED ORDER — CARVEDILOL 3.125 MG PO TABS: 3.1250 mg | ORAL_TABLET | Freq: Two times a day (BID) | ORAL | 3 refills | Status: DC

## 2019-08-31 MED ORDER — ASPIRIN 81 MG PO TBEC: 81.0000 mg | DELAYED_RELEASE_TABLET | Freq: Every day | ORAL | 1 refills | Status: AC

## 2019-08-31 MED ORDER — ATORVASTATIN CALCIUM 10 MG PO TABS: 10.0000 mg | ORAL_TABLET | Freq: Every day | ORAL | 3 refills | Status: DC

## 2019-08-31 MED ORDER — LISINOPRIL 20 MG PO TABS: 20.0000 mg | ORAL_TABLET | Freq: Every day | ORAL | 3 refills | Status: DC

## 2019-08-31 MED ORDER — CARVEDILOL 3.125 MG PO TABS: 3.1250 mg | ORAL_TABLET | Freq: Two times a day (BID) | ORAL | 3 refills | Status: AC

## 2019-08-31 NOTE — Patient Instructions (Addendum)
Medication Instructions:  Start Xarelto 20 mg daily  Start Coreg 3.125 twice daily  *If you need a refill on your cardiac medications before your next appointment, please call your pharmacy*  Lab Work: Lipid profile  Attached are the lab orders that are needed before your upcoming appointment, please come in 1 week, anytime to have your labs drawn.   They are fasting labs, so nothing to eat or drink after midnight.  Lab hours: 8:00-4:00 lunch hours 12:45-1:45  If you have labs (blood work) drawn today and your tests are completely normal, you will receive your results only by: Marland Kitchen MyChart Message (if you have MyChart) OR . A paper copy in the mail If you have any lab test that is abnormal or we need to change your treatment, we will call you to review the results.  Follow-Up: At [facility], you and your health needs are our priority.  As part of our continuing mission to provide you with exceptional heart care, we have created designated Provider Care Teams.  These Care Teams include your primary Cardiologist (physician) and Advanced Practice Providers (APPs -  Physician Assistants and Nurse Practitioners) who all work together to provide you with the care you need, when you need it.  Your next appointment:   3 month(s)  The format for your next appointment:   In Person  Provider:   Lennie Odor, MD

## 2019-09-01 ENCOUNTER — Telehealth: Payer: Self-pay | Admitting: Cardiovascular Disease

## 2019-09-01 NOTE — Telephone Encounter (Signed)
   Patient states he is calling to inquire about whether he should be taking both Lisinopril (ZESTRIL) 20 MG tablet and Hydrochlorothiazide Lisinopryl 25 MG for his BP. Please return call to [phone] (ext: [phone]) and ask for [place].

## 2019-09-01 NOTE — Telephone Encounter (Signed)
Returned call to patient, advised per last OV note 2/10 he should only be taking lisinopril 20 mg daily.   Patient verbalized understanding,

## 2019-09-07 ENCOUNTER — Encounter: Payer: Medicaid Other | Admitting: Vascular Surgery

## 2019-09-12 LAB — LIPID PANEL
Chol/HDL Ratio: 2.3 ratio (ref 0.0–5.0)
Cholesterol, Total: 222 mg/dL — ABNORMAL HIGH (ref 100–199)
HDL: 96 mg/dL (ref >39)
LDL Chol Calc (NIH): 109 mg/dL — ABNORMAL HIGH (ref 0–99)
Triglycerides: 99 mg/dL (ref 0–149)
VLDL Cholesterol Cal: 17 mg/dL (ref 5–40)

## 2019-09-13 ENCOUNTER — Telehealth: Payer: Self-pay | Admitting: Cardiovascular Disease

## 2019-09-13 ENCOUNTER — Telehealth (HOSPITAL_COMMUNITY): Payer: Self-pay

## 2019-09-13 NOTE — Telephone Encounter (Signed)
Patient returning call for lab results. 

## 2019-09-13 NOTE — Telephone Encounter (Signed)

## 2019-09-14 ENCOUNTER — Other Ambulatory Visit: Payer: Self-pay

## 2019-09-14 ENCOUNTER — Encounter: Payer: Self-pay | Admitting: Vascular Surgery

## 2019-09-14 ENCOUNTER — Ambulatory Visit (INDEPENDENT_AMBULATORY_CARE_PROVIDER_SITE_OTHER): Payer: Self-pay | Admitting: Vascular Surgery

## 2019-09-14 VITALS — BP 126/87 | HR 71 | Temp 98.1°F | Resp 20 | Ht 72.0 in | Wt 203.0 lb

## 2019-09-14 DIAGNOSIS — I70299 Other atherosclerosis of native arteries of extremities, unspecified extremity: Secondary | ICD-10-CM

## 2019-09-14 DIAGNOSIS — L97909 Non-pressure chronic ulcer of unspecified part of unspecified lower leg with unspecified severity: Secondary | ICD-10-CM

## 2019-09-14 DIAGNOSIS — Z48812 Encounter for surgical aftercare following surgery on the circulatory system: Secondary | ICD-10-CM

## 2019-09-14 NOTE — Telephone Encounter (Signed)
Contacted patient- gave results.

## 2019-09-14 NOTE — Progress Notes (Signed)
Patient name: Mitchell Richard MRN: [id number] DOB: 1960-11-30 Sex: male  REASON FOR VISIT:   Follow-up after right femoral to popliteal artery bypass  HPI:   Mitchell Richard is a pleasant 59 y.o. male who had presented with a nonhealing wound of his Achilles.  He had a superficial femoral artery occlusion and it was felt that right femoral-popliteal artery bypass was his best chance for limb salvage.  Of note the patient was found to be Covid positive on the morning of admission but given that he had critical limb ischemia we proceeded with his surgery as planned.  He underwent a right femoral to above-knee popliteal artery bypass with a vein graft on 08/16/2019.  Postoperatively he had a palpable right popliteal pulse.  He underwent aggressive wound care and was ultimately discharged on 08/19/2019.  He comes in first first outpatient visit.  Overall he is doing well and has no specific complaints.  Dr.Hewitt had been following his Achilles wound.  He is on aspirin and is on a statin.   Current Outpatient Medications  Medication Sig Dispense Refill  . aspirin 81 MG EC tablet Take 1 tablet (81 mg total) by mouth daily at 6 (six) AM. 90 tablet 1  . atorvastatin (LIPITOR) 10 MG tablet Take 1 tablet (10 mg total) by mouth daily. 90 tablet 3  . carvedilol (COREG) 3.125 MG tablet Take 1 tablet (3.125 mg total) by mouth 2 (two) times daily. 180 tablet 3  . ibuprofen (ADVIL) 800 MG tablet Take 1 tablet (800 mg total) by mouth every 8 (eight) hours as needed. 21 tablet 0  . lisinopril (ZESTRIL) 20 MG tablet Take 1 tablet (20 mg total) by mouth daily. 90 tablet 3  . oxyCODONE-acetaminophen (PERCOCET/ROXICET) 5-325 MG tablet Take 1 tablet by mouth every 6 (six) hours as needed for moderate pain. 15 tablet 0  . rivaroxaban (XARELTO) 20 MG TABS tablet Take 1 tablet (20 mg total) by mouth daily with supper. 90 tablet 0   No current facility-administered medications for this visit.    REVIEW OF SYSTEMS:  [X]   denotes positive finding, [ ]  denotes negative finding Vascular    Leg swelling    Cardiac    Chest pain or chest pressure:    Shortness of breath upon exertion:    Short of breath when lying flat:    Irregular Richard rhythm:    Constitutional    Fever or chills:     PHYSICAL EXAM:   Vitals:   09/14/19 1552  BP: 126/87  Pulse: 71  Resp: 20  Temp: 98.1 F (36.7 C)  SpO2: 98%  Weight: 203 lb (92.1 kg)  Height: 6' (1.829 m)    GENERAL: The patient is a well-nourished male, in no acute distress. The vital signs are documented above. CARDIOVASCULAR: There is a regular rate and rhythm. PULMONARY: There is good air exchange bilaterally without wheezing or rales. VASCULAR: He has a palpable right femoral and popliteal pulse.  He has a biphasic posterior tibial and biphasic peroneal signal at the posterior lateral malleolus.  He has a monophasic anterior tibial and dorsalis pedis signal. EXTREMITIES: His Achilles wound is essentially unchanged.     DATA:   No new data  MEDICAL ISSUES:   STATUS POST RIGHT FEMOROPOPLITEAL BYPASS: The patient is undergone successful revascularization of the right lower extremity.  His graft is patent with biphasic signals in the posterior tibial and peroneal positions on the right.  If Dr. Doran Durand plans any surgery on the  Achilles wound I think it is safe to proceed from my standpoint at any time.  He is on aspirin and is on a statin.  I have ordered follow-up studies in 3 months including ABIs and a graft duplex.  Of note he has monophasic signals in the left posterior tibial position but no wounds.  We will have to follow the left leg closely also.  Waverly Ferrari [facility] [phone]

## 2019-09-15 ENCOUNTER — Other Ambulatory Visit: Payer: Self-pay | Admitting: *Deleted

## 2019-09-15 DIAGNOSIS — I70219 Atherosclerosis of native arteries of extremities with intermittent claudication, unspecified extremity: Secondary | ICD-10-CM

## 2019-09-20 ENCOUNTER — Other Ambulatory Visit (HOSPITAL_COMMUNITY): Payer: Self-pay | Admitting: Orthopedic Surgery

## 2019-09-27 ENCOUNTER — Encounter (HOSPITAL_COMMUNITY): Payer: Self-pay | Admitting: Orthopedic Surgery

## 2019-09-27 NOTE — Progress Notes (Signed)
Pt denies SOB and chest pain. Pt stated that he is under the care of Dr. Flora Lipps, Cardiology. Pt stated that PCP is Dr. Jethro Poling at the [facility]. Pt denies having a cardiac cath. Pt denies recent labs. Pt agreed to contact surgeon regarding Aspirin and Xarelto pre-op instructions. Pt made aware to stop taking  vitamins, fish oil and herbal medications. Do not take any NSAIDs ie: Ibuprofen, Advil, Naproxen (Aleve), Motrin, BC and Goody Powder. Pt verbalized understanding of all pre-op instructions. PA, Anesthesiology asked to review pt cardiac history.

## 2019-09-29 ENCOUNTER — Encounter (HOSPITAL_COMMUNITY)
Admission: RE | Disposition: A | Discharge status: Home Health | Payer: Self-pay | Source: Home / Self Care | Attending: Orthopedic Surgery

## 2019-09-29 ENCOUNTER — Other Ambulatory Visit: Payer: Self-pay

## 2019-09-29 ENCOUNTER — Inpatient Hospital Stay (HOSPITAL_COMMUNITY): Payer: Medicaid Other | Admitting: Anesthesiology

## 2019-09-29 ENCOUNTER — Inpatient Hospital Stay: Payer: Self-pay

## 2019-09-29 ENCOUNTER — Inpatient Hospital Stay (HOSPITAL_COMMUNITY)
Admission: RE | Admit: 2019-09-29 | Discharge: 2019-10-02 | DRG: 501 | Disposition: A | Discharge status: Home Health | Payer: Medicaid Other | Attending: Orthopedic Surgery | Admitting: Orthopedic Surgery

## 2019-09-29 ENCOUNTER — Encounter (HOSPITAL_COMMUNITY): Payer: Self-pay | Admitting: Orthopedic Surgery

## 2019-09-29 DIAGNOSIS — R200 Anesthesia of skin: Secondary | ICD-10-CM | POA: Diagnosis not present

## 2019-09-29 DIAGNOSIS — F4310 Post-traumatic stress disorder, unspecified: Secondary | ICD-10-CM | POA: Diagnosis present

## 2019-09-29 DIAGNOSIS — B9562 Methicillin resistant Staphylococcus aureus infection as the cause of diseases classified elsewhere: Secondary | ICD-10-CM | POA: Diagnosis present

## 2019-09-29 DIAGNOSIS — I10 Essential (primary) hypertension: Secondary | ICD-10-CM | POA: Diagnosis present

## 2019-09-29 DIAGNOSIS — M86671 Other chronic osteomyelitis, right ankle and foot: Principal | ICD-10-CM

## 2019-09-29 DIAGNOSIS — F17210 Nicotine dependence, cigarettes, uncomplicated: Secondary | ICD-10-CM | POA: Diagnosis present

## 2019-09-29 DIAGNOSIS — M869 Osteomyelitis, unspecified: Secondary | ICD-10-CM | POA: Diagnosis not present

## 2019-09-29 DIAGNOSIS — Z7901 Long term (current) use of anticoagulants: Secondary | ICD-10-CM | POA: Diagnosis not present

## 2019-09-29 DIAGNOSIS — I739 Peripheral vascular disease, unspecified: Secondary | ICD-10-CM | POA: Diagnosis not present

## 2019-09-29 DIAGNOSIS — L97418 Non-pressure chronic ulcer of right heel and midfoot with other specified severity: Secondary | ICD-10-CM | POA: Diagnosis present

## 2019-09-29 DIAGNOSIS — M1990 Unspecified osteoarthritis, unspecified site: Secondary | ICD-10-CM | POA: Diagnosis present

## 2019-09-29 DIAGNOSIS — Z7982 Long term (current) use of aspirin: Secondary | ICD-10-CM | POA: Diagnosis not present

## 2019-09-29 DIAGNOSIS — Z978 Presence of other specified devices: Secondary | ICD-10-CM | POA: Diagnosis not present

## 2019-09-29 DIAGNOSIS — I2510 Atherosclerotic heart disease of native coronary artery without angina pectoris: Secondary | ICD-10-CM

## 2019-09-29 DIAGNOSIS — Z8614 Personal history of Methicillin resistant Staphylococcus aureus infection: Secondary | ICD-10-CM

## 2019-09-29 DIAGNOSIS — Z823 Family history of stroke: Secondary | ICD-10-CM

## 2019-09-29 DIAGNOSIS — S91011S Laceration without foreign body, right ankle, sequela: Secondary | ICD-10-CM | POA: Diagnosis not present

## 2019-09-29 DIAGNOSIS — Z8249 Family history of ischemic heart disease and other diseases of the circulatory system: Secondary | ICD-10-CM

## 2019-09-29 DIAGNOSIS — Z9862 Peripheral vascular angioplasty status: Secondary | ICD-10-CM

## 2019-09-29 DIAGNOSIS — B965 Pseudomonas (aeruginosa) (mallei) (pseudomallei) as the cause of diseases classified elsewhere: Secondary | ICD-10-CM | POA: Diagnosis present

## 2019-09-29 DIAGNOSIS — L97419 Non-pressure chronic ulcer of right heel and midfoot with unspecified severity: Secondary | ICD-10-CM | POA: Diagnosis present

## 2019-09-29 DIAGNOSIS — Z95828 Presence of other vascular implants and grafts: Secondary | ICD-10-CM | POA: Diagnosis not present

## 2019-09-29 HISTORY — DX: Unspecified osteoarthritis, unspecified site: M19.90

## 2019-09-29 HISTORY — DX: Osteomyelitis, unspecified: M86.9

## 2019-09-29 HISTORY — PX: ANKLE DEBRIDEMENT: SHX1147

## 2019-09-29 HISTORY — PX: I & D EXTREMITY: SHX5045

## 2019-09-29 LAB — BASIC METABOLIC PANEL
Anion gap: 11 (ref 5–15)
BUN: 10 mg/dL (ref 8–23)
CO2: 23 mmol/L (ref 22–32)
Calcium: 9.2 mg/dL (ref 8.9–10.3)
Chloride: 104 mmol/L (ref 98–111)
Creatinine, Ser: 0.96 mg/dL (ref 0.61–1.24)
GFR calc Af Amer: >60 mL/min (ref >60)
GFR calc non Af Amer: >60 mL/min (ref >60)
Glucose, Bld: 81 mg/dL (ref 70–99)
Potassium: 3.7 mmol/L (ref 3.5–5.1)
Sodium: 138 mmol/L (ref 135–145)

## 2019-09-29 LAB — CBC
HCT: 44.4 % (ref 39.0–52.0)
Hemoglobin: 14.9 g/dL (ref 13.0–17.0)
MCH: 29.3 pg (ref 26.0–34.0)
MCHC: 33.6 g/dL (ref 30.0–36.0)
MCV: 87.2 fL (ref 80.0–100.0)
Platelets: 235 10*3/uL (ref 150–400)
RBC: 5.09 MIL/uL (ref 4.22–5.81)
RDW: 14.5 % (ref 11.5–15.5)
WBC: 7.1 10*3/uL (ref 4.0–10.5)
nRBC: 0 % (ref 0.0–0.2)

## 2019-09-29 LAB — PROTIME-INR
INR: 1 (ref 0.8–1.2)
Prothrombin Time: 12.8 seconds (ref 11.4–15.2)

## 2019-09-29 LAB — C-REACTIVE PROTEIN: CRP: 0.6 mg/dL (ref <1.0)

## 2019-09-29 LAB — SEDIMENTATION RATE: Sed Rate: 5 mm/hr (ref 0–16)

## 2019-09-29 SURGERY — IRRIGATION AND DEBRIDEMENT EXTREMITY
Anesthesia: General | Site: Foot | Laterality: Right

## 2019-09-29 MED ORDER — ROCURONIUM BROMIDE 10 MG/ML (PF) SYRINGE
PREFILLED_SYRINGE | INTRAVENOUS | Status: AC
  Filled 2019-09-29: qty 10

## 2019-09-29 MED ORDER — SODIUM CHLORIDE 0.9 % IV SOLN: INTRAVENOUS | Status: DC

## 2019-09-29 MED ORDER — VANCOMYCIN HCL 2000 MG/400ML IV SOLN
2000.0000 mg | Freq: Once | INTRAVENOUS | Status: AC
  Administered 2019-09-29: 2000 mg via INTRAVENOUS
  Filled 2019-09-29 (×2): qty 400

## 2019-09-29 MED ORDER — BUPIVACAINE-EPINEPHRINE (PF) 0.5% -1:200000 IJ SOLN
INTRAMUSCULAR | Status: DC | PRN
  Administered 2019-09-29: 30 mL via PERINEURAL

## 2019-09-29 MED ORDER — DIPHENHYDRAMINE HCL 12.5 MG/5ML PO ELIX: 12.5000 mg | ORAL_SOLUTION | ORAL | Status: DC | PRN

## 2019-09-29 MED ORDER — SUGAMMADEX SODIUM 200 MG/2ML IV SOLN
INTRAVENOUS | Status: DC | PRN
  Administered 2019-09-29: 200 mg via INTRAVENOUS

## 2019-09-29 MED ORDER — VANCOMYCIN HCL 1000 MG IV SOLR
INTRAVENOUS | Status: AC
  Filled 2019-09-29: qty 1000

## 2019-09-29 MED ORDER — DEXAMETHASONE SODIUM PHOSPHATE 10 MG/ML IJ SOLN
INTRAMUSCULAR | Status: AC
  Filled 2019-09-29: qty 1

## 2019-09-29 MED ORDER — FENTANYL CITRATE (PF) 250 MCG/5ML IJ SOLN
INTRAMUSCULAR | Status: AC
  Filled 2019-09-29: qty 5

## 2019-09-29 MED ORDER — VANCOMYCIN HCL 1000 MG IV SOLR
INTRAVENOUS | Status: DC | PRN
  Administered 2019-09-29: 1000 mg

## 2019-09-29 MED ORDER — OXYCODONE HCL 5 MG/5ML PO SOLN: 5.0000 mg | Freq: Once | ORAL | Status: DC | PRN

## 2019-09-29 MED ORDER — ATORVASTATIN CALCIUM 10 MG PO TABS
10.0000 mg | ORAL_TABLET | Freq: Every day | ORAL | Status: DC
  Administered 2019-09-29 – 2019-10-01 (×3): 10 mg via ORAL
  Filled 2019-09-29 (×3): qty 1

## 2019-09-29 MED ORDER — ONDANSETRON HCL 4 MG PO TABS: 4.0000 mg | ORAL_TABLET | Freq: Four times a day (QID) | ORAL | Status: DC | PRN

## 2019-09-29 MED ORDER — PROMETHAZINE HCL 25 MG/ML IJ SOLN: 6.2500 mg | INTRAMUSCULAR | Status: DC | PRN

## 2019-09-29 MED ORDER — DULOXETINE HCL 60 MG PO CPEP
60.0000 mg | ORAL_CAPSULE | Freq: Every day | ORAL | Status: DC
  Administered 2019-09-29 – 2019-10-02 (×4): 60 mg via ORAL
  Filled 2019-09-29 (×5): qty 1

## 2019-09-29 MED ORDER — OXYCODONE HCL 5 MG PO TABS
5.0000 mg | ORAL_TABLET | ORAL | Status: DC | PRN
  Administered 2019-09-29: 10 mg via ORAL
  Administered 2019-10-01: 5 mg via ORAL
  Administered 2019-10-01: 10 mg via ORAL
  Filled 2019-09-29: qty 1
  Filled 2019-09-29 (×2): qty 2

## 2019-09-29 MED ORDER — OXYCODONE HCL 5 MG PO TABS: 5.0000 mg | ORAL_TABLET | Freq: Once | ORAL | Status: DC | PRN

## 2019-09-29 MED ORDER — MIDAZOLAM HCL 2 MG/2ML IJ SOLN: 2.0000 mg | Freq: Once | INTRAMUSCULAR | Status: AC

## 2019-09-29 MED ORDER — CARVEDILOL 3.125 MG PO TABS
3.1250 mg | ORAL_TABLET | Freq: Two times a day (BID) | ORAL | Status: DC
  Administered 2019-09-29 – 2019-10-02 (×6): 3.125 mg via ORAL
  Filled 2019-09-29 (×7): qty 1

## 2019-09-29 MED ORDER — EPHEDRINE SULFATE-NACL 50-0.9 MG/10ML-% IV SOSY
PREFILLED_SYRINGE | INTRAVENOUS | Status: DC | PRN
  Administered 2019-09-29 (×2): 10 mg via INTRAVENOUS
  Administered 2019-09-29 (×2): 5 mg via INTRAVENOUS

## 2019-09-29 MED ORDER — FENTANYL CITRATE (PF) 100 MCG/2ML IJ SOLN
INTRAMUSCULAR | Status: DC | PRN
  Administered 2019-09-29: 100 ug via INTRAVENOUS

## 2019-09-29 MED ORDER — CARVEDILOL 3.125 MG PO TABS: 3.1250 mg | ORAL_TABLET | Freq: Two times a day (BID) | ORAL | Status: DC

## 2019-09-29 MED ORDER — LABETALOL HCL 5 MG/ML IV SOLN
10.0000 mg | Freq: Once | INTRAVENOUS | Status: AC
  Administered 2019-09-29: 10 mg via INTRAVENOUS
  Filled 2019-09-29: qty 4

## 2019-09-29 MED ORDER — HYDROMORPHONE HCL 1 MG/ML IJ SOLN: 0.5000 mg | INTRAMUSCULAR | Status: DC | PRN

## 2019-09-29 MED ORDER — SODIUM CHLORIDE 0.9 % IV SOLN
2.0000 g | INTRAVENOUS | Status: DC
  Administered 2019-09-30: 2 g via INTRAVENOUS
  Filled 2019-09-29: qty 20

## 2019-09-29 MED ORDER — AMITRIPTYLINE HCL 50 MG PO TABS
75.0000 mg | ORAL_TABLET | Freq: Every day | ORAL | Status: DC
  Administered 2019-09-29 – 2019-10-01 (×3): 75 mg via ORAL
  Filled 2019-09-29 (×4): qty 1

## 2019-09-29 MED ORDER — EPHEDRINE 5 MG/ML INJ
INTRAVENOUS | Status: AC
  Filled 2019-09-29: qty 10

## 2019-09-29 MED ORDER — ONDANSETRON HCL 4 MG/2ML IJ SOLN
INTRAMUSCULAR | Status: AC
  Filled 2019-09-29: qty 2

## 2019-09-29 MED ORDER — METHOCARBAMOL 500 MG PO TABS: 500.0000 mg | ORAL_TABLET | Freq: Four times a day (QID) | ORAL | Status: DC | PRN

## 2019-09-29 MED ORDER — OXYCODONE HCL 5 MG PO TABS
10.0000 mg | ORAL_TABLET | ORAL | Status: DC | PRN
  Administered 2019-10-01: 15 mg via ORAL
  Filled 2019-09-29: qty 3

## 2019-09-29 MED ORDER — PIPERACILLIN-TAZOBACTAM 3.375 G IVPB
3.3750 g | Freq: Three times a day (TID) | INTRAVENOUS | Status: DC
  Administered 2019-09-29: 3.375 g via INTRAVENOUS
  Filled 2019-09-29: qty 50

## 2019-09-29 MED ORDER — ONDANSETRON HCL 4 MG/2ML IJ SOLN: 4.0000 mg | Freq: Four times a day (QID) | INTRAMUSCULAR | Status: DC | PRN

## 2019-09-29 MED ORDER — DEXAMETHASONE SODIUM PHOSPHATE 10 MG/ML IJ SOLN
INTRAMUSCULAR | Status: DC | PRN
  Administered 2019-09-29: 5 mg via INTRAVENOUS

## 2019-09-29 MED ORDER — CEFAZOLIN SODIUM-DEXTROSE 2-4 GM/100ML-% IV SOLN
2.0000 g | INTRAVENOUS | Status: AC
  Administered 2019-09-29: 2 g via INTRAVENOUS
  Filled 2019-09-29: qty 100

## 2019-09-29 MED ORDER — CHLORHEXIDINE GLUCONATE 4 % EX LIQD: 60.0000 mL | Freq: Once | CUTANEOUS | Status: DC

## 2019-09-29 MED ORDER — ENOXAPARIN SODIUM 40 MG/0.4ML ~~LOC~~ SOLN: 40.0000 mg | SUBCUTANEOUS | Status: DC

## 2019-09-29 MED ORDER — METHOCARBAMOL 1000 MG/10ML IJ SOLN
500.0000 mg | Freq: Four times a day (QID) | INTRAVENOUS | Status: DC | PRN
  Filled 2019-09-29: qty 5

## 2019-09-29 MED ORDER — ASPIRIN EC 81 MG PO TBEC
81.0000 mg | DELAYED_RELEASE_TABLET | Freq: Every day | ORAL | Status: DC
  Administered 2019-09-30 – 2019-10-02 (×3): 81 mg via ORAL
  Filled 2019-09-29 (×4): qty 1

## 2019-09-29 MED ORDER — LACTATED RINGERS IV SOLN: INTRAVENOUS | Status: DC

## 2019-09-29 MED ORDER — FENTANYL CITRATE (PF) 100 MCG/2ML IJ SOLN: 50.0000 ug | Freq: Once | INTRAMUSCULAR | Status: AC

## 2019-09-29 MED ORDER — VANCOMYCIN HCL 1250 MG/250ML IV SOLN
1250.0000 mg | Freq: Two times a day (BID) | INTRAVENOUS | Status: DC
  Administered 2019-09-30 – 2019-10-02 (×5): 1250 mg via INTRAVENOUS
  Filled 2019-09-29 (×7): qty 250

## 2019-09-29 MED ORDER — SODIUM CHLORIDE 0.9 % IR SOLN
Status: DC | PRN
  Administered 2019-09-29: 1000 mL
  Administered 2019-09-29: 3000 mL

## 2019-09-29 MED ORDER — LIDOCAINE 2% (20 MG/ML) 5 ML SYRINGE
INTRAMUSCULAR | Status: AC
  Filled 2019-09-29: qty 5

## 2019-09-29 MED ORDER — MIDAZOLAM HCL 2 MG/2ML IJ SOLN
INTRAMUSCULAR | Status: AC
  Administered 2019-09-29: 2 mg via INTRAVENOUS
  Filled 2019-09-29: qty 2

## 2019-09-29 MED ORDER — ACETAMINOPHEN 325 MG PO TABS
325.0000 mg | ORAL_TABLET | Freq: Four times a day (QID) | ORAL | Status: DC | PRN
  Administered 2019-10-01 (×2): 650 mg via ORAL
  Filled 2019-09-29 (×2): qty 2

## 2019-09-29 MED ORDER — FENTANYL CITRATE (PF) 100 MCG/2ML IJ SOLN
INTRAMUSCULAR | Status: AC
  Administered 2019-09-29: 50 ug via INTRAVENOUS
  Filled 2019-09-29: qty 2

## 2019-09-29 MED ORDER — LISINOPRIL 20 MG PO TABS
20.0000 mg | ORAL_TABLET | Freq: Every day | ORAL | Status: DC
  Administered 2019-09-29 – 2019-10-02 (×4): 20 mg via ORAL
  Filled 2019-09-29 (×5): qty 1

## 2019-09-29 MED ORDER — LIDOCAINE 2% (20 MG/ML) 5 ML SYRINGE
INTRAMUSCULAR | Status: DC | PRN
  Administered 2019-09-29: 60 mg via INTRAVENOUS

## 2019-09-29 MED ORDER — ONDANSETRON HCL 4 MG/2ML IJ SOLN
INTRAMUSCULAR | Status: DC | PRN
  Administered 2019-09-29: 4 mg via INTRAVENOUS

## 2019-09-29 MED ORDER — SENNA 8.6 MG PO TABS
1.0000 | ORAL_TABLET | Freq: Two times a day (BID) | ORAL | Status: DC
  Administered 2019-09-29 – 2019-10-02 (×6): 8.6 mg via ORAL
  Filled 2019-09-29 (×6): qty 1

## 2019-09-29 MED ORDER — RIVAROXABAN 20 MG PO TABS
20.0000 mg | ORAL_TABLET | Freq: Every day | ORAL | Status: DC
  Administered 2019-09-30 – 2019-10-02 (×3): 20 mg via ORAL
  Filled 2019-09-29 (×3): qty 1

## 2019-09-29 MED ORDER — FENTANYL CITRATE (PF) 100 MCG/2ML IJ SOLN: 25.0000 ug | INTRAMUSCULAR | Status: DC | PRN

## 2019-09-29 MED ORDER — PROPOFOL 10 MG/ML IV BOLUS
INTRAVENOUS | Status: AC
  Filled 2019-09-29: qty 20

## 2019-09-29 MED ORDER — PROPOFOL 10 MG/ML IV BOLUS
INTRAVENOUS | Status: DC | PRN
  Administered 2019-09-29: 150 mg via INTRAVENOUS
  Administered 2019-09-29: 50 mg via INTRAVENOUS

## 2019-09-29 MED ORDER — ROCURONIUM BROMIDE 50 MG/5ML IV SOSY
PREFILLED_SYRINGE | INTRAVENOUS | Status: DC | PRN
  Administered 2019-09-29: 50 mg via INTRAVENOUS

## 2019-09-29 MED ORDER — DOCUSATE SODIUM 100 MG PO CAPS
100.0000 mg | ORAL_CAPSULE | Freq: Two times a day (BID) | ORAL | Status: DC
  Administered 2019-09-29 – 2019-10-02 (×6): 100 mg via ORAL
  Filled 2019-09-29 (×5): qty 1

## 2019-09-29 MED ORDER — MIDAZOLAM HCL 2 MG/2ML IJ SOLN
INTRAMUSCULAR | Status: AC
  Filled 2019-09-29: qty 2

## 2019-09-29 SURGICAL SUPPLY — 52 items
BANDAGE ESMARK 6X9 LF (GAUZE/BANDAGES/DRESSINGS) ×2 IMPLANT
BLADE LONG MED 31X9 (MISCELLANEOUS) ×2 IMPLANT
BLADE SURG 10 STRL SS (BLADE) ×2 IMPLANT
BNDG COHESIVE 4X5 TAN STRL (GAUZE/BANDAGES/DRESSINGS) ×2 IMPLANT
BNDG COHESIVE 6X5 TAN STRL LF (GAUZE/BANDAGES/DRESSINGS) ×2 IMPLANT
BNDG CONFORM 3 STRL LF (GAUZE/BANDAGES/DRESSINGS) ×2 IMPLANT
BNDG ELASTIC 4X5.8 VLCR STR LF (GAUZE/BANDAGES/DRESSINGS) ×2 IMPLANT
BNDG ELASTIC 6X15 VLCR STRL LF (GAUZE/BANDAGES/DRESSINGS) ×2 IMPLANT
BNDG ESMARK 6X9 LF (GAUZE/BANDAGES/DRESSINGS) ×4
CANISTER SUCT 3000ML PPV (MISCELLANEOUS) ×2 IMPLANT
CANISTER WOUNDNEG PRESSURE 500 (CANNISTER) ×2 IMPLANT
CHLORAPREP W/TINT 26 (MISCELLANEOUS) ×2 IMPLANT
COVER SURGICAL LIGHT HANDLE (MISCELLANEOUS) ×2 IMPLANT
COVER WAND RF STERILE (DRAPES) ×2 IMPLANT
CUFF TOURN SGL QUICK 34 (TOURNIQUET CUFF) ×2
CUFF TOURN SGL QUICK 42 (TOURNIQUET CUFF) IMPLANT
CUFF TRNQT CYL 34X4.125X (TOURNIQUET CUFF) ×1 IMPLANT
DRAPE INCISE IOBAN 66X45 STRL (DRAPES) ×2 IMPLANT
DRSG MEPITEL 4X7.2 (GAUZE/BANDAGES/DRESSINGS) ×2 IMPLANT
DRSG PAD ABDOMINAL 8X10 ST (GAUZE/BANDAGES/DRESSINGS) IMPLANT
DRSG VAC ATS SM SENSATRAC (GAUZE/BANDAGES/DRESSINGS) ×2 IMPLANT
ELECT REM PT RETURN 9FT ADLT (ELECTROSURGICAL) ×2
ELECTRODE REM PT RTRN 9FT ADLT (ELECTROSURGICAL) ×1 IMPLANT
EVACUATOR SILICONE 100CC (DRAIN) IMPLANT
GAUZE SPONGE 4X4 12PLY STRL (GAUZE/BANDAGES/DRESSINGS) IMPLANT
GLOVE BIO SURGEON STRL SZ8 (GLOVE) ×2 IMPLANT
GLOVE BIOGEL PI IND STRL 8 (GLOVE) ×2 IMPLANT
GLOVE BIOGEL PI INDICATOR 8 (GLOVE) ×2
GLOVE ECLIPSE 8.0 STRL XLNG CF (GLOVE) ×2 IMPLANT
GOWN STRL REUS W/ TWL XL LVL3 (GOWN DISPOSABLE) ×2 IMPLANT
GOWN STRL REUS W/TWL XL LVL3 (GOWN DISPOSABLE) ×4
KIT BASIN OR (CUSTOM PROCEDURE TRAY) ×2 IMPLANT
KIT TURNOVER KIT B (KITS) ×2 IMPLANT
NS IRRIG 1000ML POUR BTL (IV SOLUTION) ×2 IMPLANT
PACK ORTHO EXTREMITY (CUSTOM PROCEDURE TRAY) ×2 IMPLANT
PAD ARMBOARD 7.5X6 YLW CONV (MISCELLANEOUS) ×4 IMPLANT
PAD CAST 4YDX4 CTTN HI CHSV (CAST SUPPLIES) IMPLANT
PADDING CAST COTTON 4X4 STRL (CAST SUPPLIES)
SET CYSTO W/LG BORE CLAMP LF (SET/KITS/TRAYS/PACK) ×4 IMPLANT
SPONGE LAP 4X18 RFD (DISPOSABLE) ×2 IMPLANT
SUCTION FRAZIER HANDLE 10FR (MISCELLANEOUS) ×2
SUCTION TUBE FRAZIER 10FR DISP (MISCELLANEOUS) ×1 IMPLANT
SUT ETHILON 2 0 FS 18 (SUTURE) IMPLANT
SUT ETHILON 2 0 PSLX (SUTURE) ×2 IMPLANT
SUT ETHILON 3 0 PS 1 (SUTURE) ×4 IMPLANT
SUT PDS AB 0 CT1 27 (SUTURE) ×2 IMPLANT
SUT VIC AB 2-0 CT1 27 (SUTURE)
SUT VIC AB 2-0 CT1 TAPERPNT 27 (SUTURE) IMPLANT
TOWEL GREEN STERILE (TOWEL DISPOSABLE) ×2 IMPLANT
TOWEL GREEN STERILE FF (TOWEL DISPOSABLE) ×2 IMPLANT
TUBE CONNECTING 12X1/4 (SUCTIONS) ×2 IMPLANT
YANKAUER SUCT BULB TIP NO VENT (SUCTIONS) ×2 IMPLANT

## 2019-09-29 NOTE — Transfer of Care (Signed)
Immediate Anesthesia Transfer of Care Note  Patient: Mitchell Richard  Procedure(s) Performed: Right ankle wound debridement; calcaneus exostectomy; possible wound vac (Right Foot)  Patient Location: PACU  Anesthesia Type:General and Regional  Level of Consciousness: awake, alert  and oriented  Airway & Oxygen Therapy: Patient Spontanous Breathing  Post-op Assessment: Report given to RN and Post -op Vital signs reviewed and stable  Post vital signs: Reviewed and stable  Last Vitals:  Vitals Value Taken Time  BP 144/98   Temp 97.5   Pulse 77   Resp 17 09/29/19 1108  SpO2 98   Vitals shown include unvalidated device data.  Last Pain:  Vitals:   09/29/19 0915  TempSrc:   PainSc: 0-No pain         Complications: No apparent anesthesia complications
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
## 2019-09-29 NOTE — Anesthesia Procedure Notes (Signed)
Anesthesia Regional Block: Popliteal block   Pre-Anesthetic Checklist: ,, timeout performed, Correct Patient, Correct Site, Correct Laterality, Correct Procedure, Correct Position, site marked, Risks and benefits discussed,  Surgical consent,  Pre-op evaluation,  At surgeon's request and post-op pain management  Laterality: Right  Prep: chloraprep       Needles:  Injection technique: Single-shot  Needle Type: Echogenic Needle     Needle Length: 10cm  Needle Gauge: 21     Additional Needles:   Narrative:  Start time: 09/29/2019 9:03 AM End time: 09/29/2019 9:06 AM Injection made incrementally with aspirations every 5 mL.  Performed by: Personally  Anesthesiologist: Beryle Lathe, MD  Additional Notes: No pain on injection. No increased resistance to injection. Injection made in 5cc increments. Good needle visualization. Patient tolerated the procedure well.
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
## 2019-09-29 NOTE — Anesthesia Postprocedure Evaluation (Signed)
Anesthesia Post Note  Patient: Mitchell Richard  Procedure(s) Performed: Right ankle wound debridement; calcaneus exostectomy; possible wound vac (Right Foot)     Patient location during evaluation: PACU Anesthesia Type: General Level of consciousness: awake and alert Pain management: pain level controlled Vital Signs Assessment: post-procedure vital signs reviewed and stable Respiratory status: spontaneous breathing, nonlabored ventilation and respiratory function stable Cardiovascular status: blood pressure returned to baseline and stable Postop Assessment: no apparent nausea or vomiting Anesthetic complications: no    Last Vitals:  Vitals:   09/29/19 1235 09/29/19 1250  BP: (!) 155/95 (!) 151/97  Pulse: 61 68  Resp: 13 19  Temp:    SpO2: 98% 96%    Last Pain:  Vitals:   09/29/19 1150  TempSrc:   PainSc: 0-No pain                 Beryle Lathe

## 2019-09-29 NOTE — Plan of Care (Signed)
  Problem: Education: Goal: Knowledge of General Education information will improve Description Including pain rating scale, medication(s)/side effects and non-pharmacologic comfort measures Outcome: Progressing   

## 2019-09-29 NOTE — Anesthesia Procedure Notes (Signed)
Procedure Name: Intubation Date/Time: 09/29/2019 9:35 AM Performed by: Babs Bertin, CRNA Pre-anesthesia Checklist: Patient identified, Emergency Drugs available, Suction available and Patient being monitored Patient Re-evaluated:Patient Re-evaluated prior to induction Oxygen Delivery Method: Circle System Utilized Preoxygenation: Pre-oxygenation with 100% oxygen Induction Type: IV induction Ventilation: Mask ventilation without difficulty Laryngoscope Size: Mac and 3 Grade View: Grade I Tube type: Oral Tube size: 7.5 mm Number of attempts: 1 Airway Equipment and Method: Stylet and Oral airway Placement Confirmation: ETT inserted through vocal cords under direct vision,  positive ETCO2 and breath sounds checked- equal and bilateral Secured at: 22 cm Tube secured with: Tape Dental Injury: Teeth and Oropharynx as per pre-operative assessment

## 2019-09-29 NOTE — Anesthesia Preprocedure Evaluation (Addendum)
Anesthesia Evaluation  Patient identified by MRN, date of birth, ID band Patient awake    Reviewed: Allergy & Precautions, NPO status , Patient's Chart, lab work & pertinent test results  History of Anesthesia Complications Negative for: history of anesthetic complications  Airway Mallampati: I  TM Distance: >3 FB Neck ROM: Full    Dental  (+) Dental Advisory Given, Missing, Poor Dentition, Chipped   Pulmonary Current Smoker and Patient abstained from smoking.,    Pulmonary exam normal        Cardiovascular hypertension, Pt. on home beta blockers and Pt. on medications + Peripheral Vascular Disease and +CHF   Rhythm:Regular Rate:Bradycardia   '20 TTE - EF 35 to 40%. Mildly increased left ventricular hypertrophy. Inferior akinesis.     Neuro/Psych PSYCHIATRIC DISORDERS Anxiety negative neurological ROS     GI/Hepatic negative GI ROS, Neg liver ROS,   Endo/Other  negative endocrine ROS  Renal/GU negative Renal ROS     Musculoskeletal  (+) Arthritis ,  Osteomyelitis    Abdominal   Peds  Hematology  On xarelto    Anesthesia Other Findings Covid+ 08/16/19, asymptomatic    Reproductive/Obstetrics                          Anesthesia Physical Anesthesia Plan  ASA: III  Anesthesia Plan: General   Post-op Pain Management:    Induction: Intravenous  PONV Risk Score and Plan: 3 and Treatment may vary due to age or medical condition, Ondansetron, Dexamethasone and Midazolam  Airway Management Planned: Oral ETT  Additional Equipment: None  Intra-op Plan:   Post-operative Plan: Extubation in OR  Informed Consent: I have reviewed the patients History and Physical, chart, labs and discussed the procedure including the risks, benefits and alternatives for the proposed anesthesia with the patient or authorized representative who has indicated his/her understanding and acceptance.      Dental advisory given  Plan Discussed with: CRNA and Anesthesiologist  Anesthesia Plan Comments:       Anesthesia Quick Evaluation

## 2019-09-29 NOTE — Consult Note (Signed)
[facility]  Total days of antibiotics 1       Reason for Consult: chronic osteomyelitis of right ankle    Referring Physician: hewitt  Active Problems:   Osteomyelitis of right foot [facility])    HPI: Mitchell Richard is a 59 y.o. male with CAD,tobacco use, PAD, with hx of MRSA right calcaneal osteomyelitis, s/p right CF to popliteal artery bypass with SVG in late January 2021 for non healing wound of his achilles. Despite revascularization, wound to right ankle was not healing, had exposed bone and tendon. Dr Victorino Dike took him to OR for right achilles tendon debridement, right calcenaus exostectomy and wound vac placement. Patient was taken off of oral abtx in the weeks prior to surgery. He denies any new fevers, nightsweats or subjective chills. The OR cultures have already identified GPC. He reports that his foot feels numb since surgery  Patient also recovered from covid in jan-feb 2021 Past Medical History:  Diagnosis Date  . Arthritis   . 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 Hypertension   . Osteomyelitis ([facility])    right calcaneus  . PAD (peripheral artery disease) ([facility])   . PTSD (post-traumatic stress disorder)   . Ulcer of right heel, with necrosis of bone ([facility])     Allergies: No Known Allergies  MEDICATIONS: . docusate sodium  100 mg Oral BID  . [START ON 09/30/2019] enoxaparin (LOVENOX) injection  40 mg Subcutaneous Q24H  . senna  1 tablet Oral BID    Social History   Tobacco Use  . Smoking status: Current Every Day Smoker    Packs/day: 0.50    Years: 40.00    Pack years: 20.00    Types: Cigarettes  . Smokeless tobacco: Never Used  Substance Use Topics  . Alcohol use: Not Currently  . Drug use: Not Currently    Types: Cocaine, Marijuana    Comment: cocaine use 08/13/19    Family History  Problem Relation Age of Onset  . Peripheral Artery Disease Mother   . Stroke Father      Review of Systems   Constitutional: Negative for fever, chills, diaphoresis, activity change, appetite change, fatigue and unexpected weight change.  HENT: Negative for congestion, sore throat, rhinorrhea, sneezing, trouble swallowing and sinus pressure.  Eyes: Negative for photophobia and visual disturbance.  Respiratory: Negative for cough, chest tightness, shortness of breath, wheezing and stridor.  Cardiovascular: Negative for chest pain, palpitations and leg swelling.  Gastrointestinal: Negative for nausea, vomiting, abdominal pain, diarrhea, constipation, blood in stool, abdominal distention and anal bleeding.  Genitourinary: Negative for dysuria, hematuria, flank pain and difficulty urinating.  Musculoskeletal: Negative for myalgias, back pain, joint swelling, arthralgias and gait problem.  Skin: chronic right ankle wound Neurological: Negative for dizziness, tremors, weakness and light-headedness.  Hematological: Negative for adenopathy. Does not bruise/bleed easily.  Psychiatric/Behavioral: Negative for behavioral problems, confusion, sleep disturbance, dysphoric mood, decreased concentration and agitation.     OBJECTIVE: Temp:  [97.4 F (36.3 C)-98.1 F (36.7 C)] 97.4 F (36.3 C) (03/11 1503) Pulse Rate:  [53-86] 66 (03/11 1503) Resp:  [11-20] 18 (03/11 1503) BP: (122-189)/(91-129) 122/98 (03/11 1503) SpO2:  [91 %-100 %] 91 % (03/11 1503) Weight:  [97.5 kg] 97.5 kg (03/11 0720) Physical Exam  Constitutional: He is oriented to person, place, and time. He appears well-developed and well-nourished. No distress.  HENT:  Mouth/Throat: Oropharynx is clear and moist. No oropharyngeal exudate.  Cardiovascular: Normal rate, regular rhythm and normal heart sounds. Exam reveals no gallop and no friction rub.  No murmur heard.  Pulmonary/Chest: Effort normal and breath sounds normal. No respiratory distress. He has no wheezes.  Abdominal: Soft. Bowel sounds are normal. He exhibits no distension. There is  no tenderness.  Lymphadenopathy:  He has no cervical adenopathy.  Neurological: He is alert and oriented to person, place, and time.  SAY:TKZSW foot and ankle wrapped- wound vac in place Skin: Skin is warm and dry. No rash noted. No erythema.  Psychiatric: He has a normal mood and affect. His behavior is normal.     LABS: Results for orders placed or performed during the hospital encounter of 09/29/19 (from the past 48 hour(s))  Basic metabolic panel     Status: None   Collection Time: 09/29/19  7:56 AM  Result Value Ref Range   Sodium 138 135 - 145 mmol/L   Potassium 3.7 3.5 - 5.1 mmol/L   Chloride 104 98 - 111 mmol/L   CO2 23 22 - 32 mmol/L   Glucose, Bld 81 70 - 99 mg/dL    Comment: Glucose reference range applies only to samples taken after fasting for at least 8 hours.   BUN 10 8 - 23 mg/dL   Creatinine, Ser 1.09 0.61 - 1.24 mg/dL   Calcium 9.2 8.9 - 32.3 mg/dL   GFR calc non Af Amer >60 >60 mL/min   GFR calc Af Amer >60 >60 mL/min   Anion gap 11 5 - 15    Comment: Performed at [facility], [address] [place], [place] [address]  CBC     Status: None   Collection Time: 09/29/19  7:56 AM  Result Value Ref Range   WBC 7.1 4.0 - 10.5 K/uL   RBC 5.09 4.22 - 5.81 MIL/uL   Hemoglobin 14.9 13.0 - 17.0 g/dL   HCT 20.2 54.2 - 70.6 %   MCV 87.2 80.0 - 100.0 fL   MCH 29.3 26.0 - 34.0 pg   MCHC 33.6 30.0 - 36.0 g/dL   RDW 23.7 62.8 - 31.5 %   Platelets 235 150 - 400 K/uL   nRBC 0.0 0.0 - 0.2 %    Comment: Performed at [facility], [address] [place], [place] [address]  PT- INR Day of Surgery     Status: None   Collection Time: 09/29/19  7:56 AM  Result Value Ref Range   Prothrombin Time 12.8 11.4 - 15.2 seconds   INR 1.0 0.8 - 1.2    Comment: (NOTE) INR goal varies based on device and disease states. Performed at [facility], [address] [place], [place] [address]   Aerobic/Anaerobic Culture (surgical/deep wound)     Status: None  (Preliminary result)   Collection Time: 09/29/19 10:08 AM   Specimen: PATH Soft tissue  Result Value Ref Range   Specimen Description TISSUE RIGHT ANKLE    Special Requests TISSUE RIGHT ANKLE    Gram Stain      RARE WBC PRESENT, PREDOMINANTLY MONONUCLEAR RARE GRAM POSITIVE COCCI Performed at [facility], [address], [place], [place] [address]    Culture PENDING    Report Status PENDING     MICRO: Reviewed. Hx of MRSA IMAGING: No results found.  Assessment/Plan:  41y M with hx of PAD, MRSA calcaneal osteomyelitis has ongoing chronic right ankle wound s/p debridement and excision, cultures pending. - plan to treat for 6-8  wk of IV Abtx - recommend to get picc line - will start with vancomycin plus ceftriaxone and narrow based upon cx identification, suspect it may still be MRSA infection - will check sed rate and crp  Foot numbness= residual anesthesia from foot surgery. Will check sensation tomorrow morning  Lacresha Fusilier B. [facility] [phone]
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
## 2019-09-29 NOTE — Discharge Instructions (Addendum)
Toni Arthurs, MD [facility]  Please read the following information regarding your care after surgery.  Medications  You only need a prescription for the narcotic pain medicine (ex. oxycodone, Percocet, Norco).  All of the other medicines listed below are available over the counter. X Aleve 2 pills twice a day for the first 3 days after surgery. X acetominophen (Tylenol) 650 mg every 4-6 hours as you need for minor to moderate pain X oxycodone as prescribed for severe pain  Narcotic pain medicine (ex. oxycodone, Percocet, Vicodin) will cause constipation.  To prevent this problem, take the following medicines while you are taking any pain medicine. X docusate sodium (Colace) 100 mg twice a day X senna (Senokot) 2 tablets twice a day  X To help prevent blood clots, resume Xarelto and ASA.  Weight Bearing. X Do not bear any weight on the operated leg or foot.  Cast / Splint / Dressing X Keep your splint, cast or dressing clean and dry.  Don't put anything (coat hanger, pencil, etc) down inside of it.  If it gets damp, use a hair dryer on the cool setting to dry it.  If it gets soaked, call the office to schedule an appointment for a cast change.   After your dressing, cast or splint is removed; you may shower, but do not soak or scrub the wound.  Allow the water to run over it, and then gently pat it dry.  Swelling It is normal for you to have swelling where you had surgery.  To reduce swelling and pain, keep your toes above your nose for at least 3 days after surgery.  It may be necessary to keep your foot or leg elevated for several weeks.  If it hurts, it should be elevated.  Follow Up Call my office at [phone] when you are discharged from the hospital or surgery center to schedule an appointment to be seen two weeks after surgery.  Call my office at [phone] if you develop a fever >101.5 F, nausea, vomiting, bleeding from the surgical site or severe pain.

## 2019-09-30 DIAGNOSIS — Z8614 Personal history of Methicillin resistant Staphylococcus aureus infection: Secondary | ICD-10-CM

## 2019-09-30 DIAGNOSIS — B965 Pseudomonas (aeruginosa) (mallei) (pseudomallei) as the cause of diseases classified elsewhere: Secondary | ICD-10-CM

## 2019-09-30 DIAGNOSIS — M869 Osteomyelitis, unspecified: Secondary | ICD-10-CM

## 2019-09-30 DIAGNOSIS — Z95828 Presence of other vascular implants and grafts: Secondary | ICD-10-CM

## 2019-09-30 DIAGNOSIS — B9562 Methicillin resistant Staphylococcus aureus infection as the cause of diseases classified elsewhere: Secondary | ICD-10-CM

## 2019-09-30 LAB — BASIC METABOLIC PANEL
Anion gap: 9 (ref 5–15)
BUN: 11 mg/dL (ref 8–23)
CO2: 23 mmol/L (ref 22–32)
Calcium: 8.9 mg/dL (ref 8.9–10.3)
Chloride: 108 mmol/L (ref 98–111)
Creatinine, Ser: 0.98 mg/dL (ref 0.61–1.24)
GFR calc Af Amer: >60 mL/min (ref >60)
GFR calc non Af Amer: >60 mL/min (ref >60)
Glucose, Bld: 84 mg/dL (ref 70–99)
Potassium: 3.8 mmol/L (ref 3.5–5.1)
Sodium: 140 mmol/L (ref 135–145)

## 2019-09-30 LAB — CBC
HCT: 38.4 % — ABNORMAL LOW (ref 39.0–52.0)
Hemoglobin: 13 g/dL (ref 13.0–17.0)
MCH: 29.5 pg (ref 26.0–34.0)
MCHC: 33.9 g/dL (ref 30.0–36.0)
MCV: 87.3 fL (ref 80.0–100.0)
Platelets: 227 10*3/uL (ref 150–400)
RBC: 4.4 MIL/uL (ref 4.22–5.81)
RDW: 14.6 % (ref 11.5–15.5)
WBC: 12.4 10*3/uL — ABNORMAL HIGH (ref 4.0–10.5)
nRBC: 0 % (ref 0.0–0.2)

## 2019-09-30 LAB — MRSA PCR SCREENING: MRSA by PCR: NEGATIVE

## 2019-09-30 MED ORDER — CHLORHEXIDINE GLUCONATE CLOTH 2 % EX PADS
6.0000 | MEDICATED_PAD | Freq: Every day | CUTANEOUS | Status: DC
  Administered 2019-09-30 – 2019-10-02 (×3): 6 via TOPICAL

## 2019-09-30 MED ORDER — OXYCODONE HCL 5 MG PO TABS: 5.0000 mg | ORAL_TABLET | ORAL | 0 refills | Status: AC | PRN

## 2019-09-30 MED ORDER — DOCUSATE SODIUM 100 MG PO CAPS: 100.0000 mg | ORAL_CAPSULE | Freq: Every day | ORAL | 2 refills | Status: AC | PRN

## 2019-09-30 MED ORDER — SODIUM CHLORIDE 0.9% FLUSH
10.0000 mL | Freq: Two times a day (BID) | INTRAVENOUS | Status: DC
  Administered 2019-09-30 – 2019-10-02 (×3): 10 mL

## 2019-09-30 MED ORDER — SENNA 8.6 MG PO TABS: 2.0000 | ORAL_TABLET | Freq: Two times a day (BID) | ORAL | 0 refills | Status: AC

## 2019-09-30 MED ORDER — SODIUM CHLORIDE 0.9% FLUSH: 10.0000 mL | INTRAVENOUS | Status: DC | PRN

## 2019-09-30 MED ORDER — SODIUM CHLORIDE 0.9 % IV SOLN
1.0000 g | Freq: Three times a day (TID) | INTRAVENOUS | Status: DC
  Administered 2019-09-30 – 2019-10-02 (×6): 1 g via INTRAVENOUS
  Filled 2019-09-30 (×9): qty 1

## 2019-09-30 NOTE — Progress Notes (Signed)
PT Progress Note for Charges    09/30/19 1400  PT Visit Information  Last PT Received On 09/30/19  PT General Charges  $$ ACUTE PT VISIT 1 Visit  PT Evaluation  $PT Eval Moderate Complexity 1 Mod  PT Treatments  $Gait Training 8-22 mins  Arletta Bale, DPT  Acute Rehabilitation Services Pager [phone] Office [phone]
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
## 2019-09-30 NOTE — Progress Notes (Signed)
[facility] for Infectious Disease    Date of Admission:  09/29/2019   Total days of antibiotics 2  ID: Mitchell Richard is a 59 y.o. male with polymicrobial Active Problems:   Osteomyelitis of right foot (HCC)    Subjective: Having some ankle pain. He had picc line placed without difficulty Medications:  . amitriptyline  75 mg Oral QHS  . aspirin EC  81 mg Oral Q0600  . atorvastatin  10 mg Oral q1800  . carvedilol  3.125 mg Oral BID WC  . Chlorhexidine Gluconate Cloth  6 each Topical Daily  . docusate sodium  100 mg Oral BID  . DULoxetine  60 mg Oral Daily  . lisinopril  20 mg Oral Daily  . rivaroxaban  20 mg Oral Daily  . senna  1 tablet Oral BID  . sodium chloride flush  10-40 mL Intracatheter Q12H    Objective: Vital signs in last 24 hours: Temp:  [97.4 F (36.3 C)-98 F (36.7 C)] 97.6 F (36.4 C) (03/12 0840) Pulse Rate:  [57-89] 57 (03/12 0840) Resp:  [13-19] 18 (03/12 0840) BP: (109-155)/(71-101) 110/84 (03/12 0840) SpO2:  [91 %-99 %] 97 % (03/12 0840) Physical Exam  Constitutional: He is oriented to person, place, and time. He appears well-developed and well-nourished. No distress.  HENT:  Mouth/Throat: Oropharynx is clear and moist. No oropharyngeal exudate.  Cardiovascular: Normal rate, regular rhythm and normal heart sounds. Exam reveals no gallop and no friction rub.  No murmur heard.  Pulmonary/Chest: Effort normal and breath sounds normal. No respiratory distress. He has no wheezes.  Abdominal: Soft. Bowel sounds are normal. He exhibits no distension. There is no tenderness.  EXB:MWUXL foot-wound vac in place Skin: Skin is warm and dry. No rash noted. No erythema.  Psychiatric: He has a normal mood and affect. His behavior is normal.    Lab Results Recent Labs    09/29/19 0756 09/30/19 0855  WBC 7.1 12.4*  HGB 14.9 13.0  HCT 44.4 38.4*  NA 138 140  K 3.7 3.8  CL 104 108  CO2 23 23  BUN 10 11  CREATININE 0.96 0.98   Liver Panel No  results for input(s): PROT, ALBUMIN, AST, ALT, ALKPHOS, BILITOT, BILIDIR, IBILI in the last 72 hours. Sedimentation Rate Recent Labs    09/29/19 1816  ESRSEDRATE 5   C-Reactive Protein Recent Labs    09/29/19 1816  CRP 0.6    Microbiology:  Studies/Results: [place] EKG SITE RITE  Result Date: 09/29/2019 If Site Rite image not attached, placement could not be confirmed due to current cardiac rhythm.    Assessment/Plan: Osteomyelitis of right ankle = will change recommendation since cx identified pseudomonas. GPC still pending identification, however he is known to have MRSA infection  recs will be 8 wk of vancomycin plus meropenem. Will let home health know and also place opat orders in  Diagnosis: Right ankle osteomyelitis  Culture Result: pseudomonas and MRSA  No Known Allergies  OPAT Orders Discharge antibiotics: Per pharmacy protocol meropenem plus vancomycin Aim for Vancomycin trough 15-20 or AUC 400-550 (unless otherwise indicated) Duration: 8 wks End Date: May 7th [place] Per Protocol:  Home health RN for IV administration and teaching; PICC line care and labs.    Labs weekly while on IV antibiotics: _x_ CBC with differential _x_ BMP twice a week  _x_ CRP/SED rate weekly _x_ Vancomycin trough weekly   _x_ Please pull PIC at completion of IV antibiotics __ Please leave PIC in  place until doctor has seen patient or been notified  Fax weekly labs to [phone]  Clinic Follow Up Appt: 6 wk in ID clinic With DR [facility] for Infectious Diseases Cell: [phone] Pager: [phone]  09/30/2019, 12:09 PM

## 2019-09-30 NOTE — Progress Notes (Signed)
Peripherally Inserted Central Catheter/Midline Placement  The IV Nurse has discussed with the patient and/or persons authorized to consent for the patient, the purpose of this procedure and the potential benefits and risks involved with this procedure.  The benefits include less needle sticks, lab draws from the catheter, and the patient may be discharged home with the catheter. Risks include, but not limited to, infection, bleeding, blood clot (thrombus formation), and puncture of an artery; nerve damage and irregular heartbeat and possibility to perform a PICC exchange if needed/ordered by physician.  Alternatives to this procedure were also discussed.  Bard Power PICC patient education guide, fact sheet on infection prevention and patient information card has been provided to patient /or left at bedside.    PICC/Midline Placement Documentation  PICC Single Lumen 09/30/19 PICC Right Basilic 46 cm 0 cm (Active)  Indication for Insertion or Continuance of Line Home intravenous therapies (PICC only) 09/30/19 1000  Exposed Catheter (cm) 0 cm 09/30/19 1000  Site Assessment Clean;Dry;Intact 09/30/19 1000  Line Status Flushed;Blood return noted 09/30/19 1000  Dressing Type Transparent 09/30/19 1000  Dressing Status Clean;Dry;Intact;Antimicrobial disc in place 09/30/19 1000  Dressing Change Due 10/07/19 09/30/19 1000       Stacie Glaze Horton 09/30/2019, 10:27 AM

## 2019-09-30 NOTE — Progress Notes (Signed)
PHARMACY CONSULT NOTE FOR:  OUTPATIENT  PARENTERAL ANTIBIOTIC THERAPY (OPAT)  Indication: osteomyelitis of right foot Regimen: Vancomycin 1250 mg IV q12h, meropenem 1 gm IV q8h End date: 11/25/2019  IV antibiotic discharge orders are pended. To discharging provider:  please sign these orders via discharge navigator,  Select New Orders & click on the button choice - Manage This Unsigned Work.     Thank you for allowing pharmacy to be a part of this patient's care.  Lulu Riding, PharmD PGY1 Pharmacy Resident  Please check AMION for all [facility] Pharmacy phone numbers After 10:00 PM, call Main Pharmacy [phone]  09/30/2019, 12:48 PM

## 2019-09-30 NOTE — Progress Notes (Addendum)
Subjective: 1 Day Post-Op Procedure(s) (LRB): Right ankle wound debridement; calcaneus exostectomy; possible wound vac (Right)  Patient reports pain as mild.  Tolerating POs well. Admits to flatus.  Denies fever, chills, N/V, CP, SOB.  Reports that his toes are numb and that he can't move them yet.  Objective:   VITALS:  Temp:  [97.4 F (36.3 C)-98 F (36.7 C)] 97.7 F (36.5 C) (03/12 0505) Pulse Rate:  [53-89] 63 (03/12 0505) Resp:  [11-20] 16 (03/12 0505) BP: (109-177)/(71-129) 119/80 (03/12 0505) SpO2:  [91 %-100 %] 96 % (03/12 0505)  General: WDWN patient in NAD. Psych:  Appropriate mood and affect. Neuro:  A&O x 3, Moving all extremities, sensation diminished to light touch HEENT:  EOMs intact Chest:  Even non-labored respirations Skin:  Dressing C/D/I, no rashes or lesions.  Wound vac intact.  Scant serosanguinous drainage. Extremities: warm/dry, no visible edema, erythema or echymosis.  No lymphadenopathy. Pulses: Popliteus 2+ MSK:  ROM:  Unable to move toes this morning, MMT: able to perform quad set, (-) Homan's    LABS Recent Labs    09/29/19 0756  HGB 14.9  WBC 7.1  PLT 235   Recent Labs    09/29/19 0756  NA 138  K 3.7  CL 104  CO2 23  BUN 10  CREATININE 0.96  GLUCOSE 81   Recent Labs    09/29/19 0756  INR 1.0     Assessment/Plan: 1 Day Post-Op Procedure(s) (LRB): Right ankle wound debridement; calcaneus exostectomy; possible wound vac (Right)  NWB R LE  Up with therapy  Prelim intra-op cultures growing rare gram + cocci.  ID consulted.  Recommend 6-8 weeks of IV ABX.  PICC line to be placed today. Patient on vanc and ceftriaxone per ID.  Appreciate ID's assistance.  DVT ppx: resume xarelto and and 81 mg ASA.   Disp: pending.  Likely back home to assisted living.  Informed patient that numbness is likely related to his block.  It may take several days for the block to wear off.  He may also experience residual numbness as long as he  has any swelling.  Prior to D/C splint taken down and wound vac to be removed and patient will go back into his NWB SLS splint reinforced with ACE bandage or coban.  D/C script for oxycodone submitted to patient's pharmacy.  Plan for outpatient f/u with Dr. Victorino Dike in 2 weeks.    Alfredo Martinez PA-C [facility] Office:  [phone]

## 2019-09-30 NOTE — Progress Notes (Signed)
IV team reported that pt is on the schedule for PICC line placement on 3/12. Explained to pt about a picc line and need for IV antibiotics.
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
## 2019-10-01 NOTE — Progress Notes (Signed)
Orthopedic Tech Progress Note Patient Details:  Sedrick Tober Dec 20, 1960 [id number]  Ortho Devices Type of Ortho Device: Post (short leg) splint Ortho Device/Splint Location: rle Ortho Device/Splint Interventions: Ordered, Application, Adjustment   Post Interventions Patient Tolerated: Well Instructions Provided: Care of device, Adjustment of device   Trinna Post 10/01/2019, 7:16 AM

## 2019-10-01 NOTE — Progress Notes (Signed)
Wound vac and SLS removed- per DR. Brooks- gauze dressing applied- ortho tech notified for SLS to be replaced. Plan for discharge today

## 2019-10-01 NOTE — Progress Notes (Signed)
   Subjective: 2 Days Post-Op Procedure(s) (LRB): Right ankle wound debridement; calcaneus exostectomy; possible wound vac (Right) Patient reports pain as 4 on 0-10 scale.   Denies CP or SOB.  Voiding without difficulty. Positive flatus. Objective: Vital signs in last 24 hours: Temp:  [97.6 F (36.4 C)-98.2 F (36.8 C)] 98.2 F (36.8 C) (03/12 2109) Pulse Rate:  [57-64] 64 (03/12 2109) Resp:  [18] 18 (03/12 0840) BP: (110-135)/(84-91) 135/91 (03/12 2109) SpO2:  [95 %-97 %] 95 % (03/12 2109)  Intake/Output from previous day: No intake/output data recorded. Intake/Output this shift: No intake/output data recorded.  Labs: Recent Labs    09/29/19 0756 09/30/19 0855  HGB 14.9 13.0   Recent Labs    09/29/19 0756 09/30/19 0855  WBC 7.1 12.4*  RBC 5.09 4.40  HCT 44.4 38.4*  PLT 235 227   Recent Labs    09/29/19 0756 09/30/19 0855  NA 138 140  K 3.7 3.8  CL 104 108  CO2 23 23  BUN 10 11  CREATININE 0.96 0.98  GLUCOSE 81 84  CALCIUM 9.2 8.9   Recent Labs    09/29/19 0756  INR 1.0    Physical Exam: Sensation intact distally Dorsiflexion/Plantar flexion intact Compartment soft bandage inplace - no drainage noted Body mass index is 29.16 kg/m.   Assessment/Plan: 2 Days Post-Op Procedure(s) (LRB): Right ankle wound debridement; calcaneus exostectomy; possible wound vac (Right) Advance diet Up with therapy  Plan on d/c to assisted living today if bed available ID recommendations noted   Alvy Beal for Dr. Venita Lick [facility] [phone] 10/01/2019, 5:26 AM

## 2019-10-02 MED ORDER — VANCOMYCIN IV (FOR PTA / DISCHARGE USE ONLY): 1250.0000 mg | Freq: Two times a day (BID) | INTRAVENOUS | 0 refills | Status: AC

## 2019-10-02 MED ORDER — MEROPENEM IV (FOR PTA / DISCHARGE USE ONLY): 1.0000 g | Freq: Three times a day (TID) | INTRAVENOUS | 0 refills | Status: AC

## 2019-10-02 NOTE — TOC Transition Note (Addendum)
Transition of Care [facility]) - CM/SW Discharge Note   Patient Details  Name: Leith Szafranski MRN: [id number] Date of Birth: 12-Sep-1960  [facility] of [facility]) CM/SW Contact:  Deveron Furlong, RN 10/02/2019, 11:40 AM   Clinical Narrative:    Patient to dc home with "nurse" who stays with him (?family friend) and HHRN to administer IV abx long term.  Pam with [facility] will have drugs delivered before 4pm.  HHRN from [facility] will visit at 4pm to administer Meropenum and Vancomycin.  Patient will leave hospital ASAP. He states his "nurse" will pick him up.  Patient states he has a walker at home and can also walk with a cane.    Final next level of care: Home w Home Health Services Barriers to Discharge: No Barriers Identified   Patient Goals and CMS Choice Patient states their goals for this hospitalization and ongoing recovery are:: to get well CMS Medicare.gov Compare Post Acute Care list provided to:: Patient Choice offered to / list presented to : Patient   Discharge Plan and Services      HH Arranged: RN [facility] Agency: [facility] Date [facility] Agency Contacted: 10/02/19 Time HH Agency Contacted: 1040 Representative spoke with at [facility] Agency: Coral Ceo

## 2019-10-02 NOTE — Plan of Care (Signed)
  Problem: Education: Goal: Knowledge of General Education information will improve Description Including pain rating scale, medication(s)/side effects and non-pharmacologic comfort measures Outcome: Progressing   

## 2019-10-02 NOTE — Progress Notes (Signed)
Pharmacy Antibiotic Note  Mitchell Richard is a 59 y.o. male admitted on 09/29/2019 with osteomyelitis of the right foot.  Pharmacy has been consulted for vancomycin dosing, patient is also on meropenem per MD. OPAT orders have been placed.   Patient growing MRSA and Psuedomonas in his right ankle wound culture (3/11) on correct antibiotics per sensitivities. No new labs today. Patient afebrile, HR and BP normal.   Plan: Continue vancomycin 1250mg  IV q12h Continue meropenem 1g IV q8h Follow renal function to adjust vanc as needed, prn levels, update OPAT prn.   Height: 6' (182.9 cm) Weight: 215 lb (97.5 kg) IBW/kg (Calculated) : 77.6  Temp (24hrs), Avg:97.7 F (36.5 C), Min:97.6 F (36.4 C), Max:97.9 F (36.6 C)  Recent Labs  Lab 09/29/19 0756 09/30/19 0855  WBC 7.1 12.4*  CREATININE 0.96 0.98    Estimated Creatinine Clearance: 95.8 mL/min (by C-G formula based on SCr of 0.98 mg/dL).    No Known Allergies  Antimicrobials this admission: Cefazolin 3/11 x1 Zosyn 3/11 x1 Ceftriaxone 3/12 x1 Meropenem 3/12 >>  Vancomycin 3/11 >>  Dose adjustments this admission: N/a  Microbiology results: Wound culture 3/11:  - PsA (intermediate Resist to cipro)  - MRSA (Resist to cipro, erythro, oxacillin)  Thank you for the interesting consult and for involving pharmacy in this patient's care.  5/11, PharmD, BCPS 10/02/2019 12:01 PM PGY-2 Pharmacy Administration Resident Please check [email or web address] for unit-specific pharmacist phone numbers

## 2019-10-02 NOTE — Progress Notes (Signed)
Subjective: 3 Days Post-Op Procedure(s) (LRB): Right ankle wound debridement; calcaneus exostectomy; possible wound vac (Right) Patient reports pain as 2 on 0-10 scale.   Patient is doing well and reports being ready to go home. He denies any N/T in right lower leg Denies CP/ SOB + voiding, flatus Wound vac D/C yesterday and a new SLS was placed  Objective: Vital signs in last 24 hours: Temp:  [97.4 F (36.3 C)-97.9 F (36.6 C)] 97.7 F (36.5 C) (03/14 0441) Pulse Rate:  [56-68] 56 (03/14 0441) Resp:  [17] 17 (03/13 1500) BP: (122-139)/(81-91) 129/81 (03/14 0441) SpO2:  [97 %-99 %] 98 % (03/14 0441)  Intake/Output from previous day: 03/13 0701 - 03/14 0700 In: 1510.1 [P.O.:720; I.V.:181.8; IV Piggyback:608.3] Out: 2250 [Urine:2250] Intake/Output this shift: No intake/output data recorded.  Recent Labs    09/29/19 0756 09/30/19 0855  HGB 14.9 13.0   Recent Labs    09/29/19 0756 09/30/19 0855  WBC 7.1 12.4*  RBC 5.09 4.40  HCT 44.4 38.4*  PLT 235 227   Recent Labs    09/29/19 0756 09/30/19 0855  NA 138 140  K 3.7 3.8  CL 104 108  CO2 23 23  BUN 10 11  CREATININE 0.96 0.98  GLUCOSE 81 84  CALCIUM 9.2 8.9   Recent Labs    09/29/19 0756  INR 1.0    Neurologically intact ABD soft Neurovascular intact Sensation intact distally Intact pulses distally Compartment soft dressing clean dry and intact, hard SLS in place preventing ankle dorsiflexion and plantar flexion Able to move toes with no pain  Assessment/Plan: 3 Days Post-Op Procedure(s) (LRB): Right ankle wound debridement; calcaneus exostectomy; possible wound vac (Right) Up with therapy NWB on right lower extremity D/C IV fluids  Abx per ID Continue DVT prophylaxis: Xarelto and Aspirin 81 mg Patient is okay to be discharged today if able to go home, [facility]    Aerika Groll R Hillary Schwegler,PA-C [facility] 10/02/2019, 7:36 AM

## 2019-10-02 NOTE — Care Management (Addendum)
Patient given cab voucher as he is unable to contact his ride home. Patient states he is able to walk into residence from car with cane. Cab called.   1455: patient now has found a ride and will be leaving with his home aid.

## 2019-10-04 LAB — AEROBIC/ANAEROBIC CULTURE W GRAM STAIN (SURGICAL/DEEP WOUND)

## 2019-10-05 NOTE — Discharge Summary (Signed)
Physician Discharge Summary  Patient ID: Mitchell Richard MRN: [id number] DOB/AGE: [birth year] 61 y.o.  Admit date: 09/29/2019 Discharge date: 10/02/2019 Admission Diagnoses:  Right calcaneus osteomyelitis; Hx of MRSA; HTN; Chronic R heel ulcer; PAD, lower limb ischemia  Discharge Diagnoses:  Active Problems:   Osteomyelitis of right foot (HCC)   History of MRSA infection same as above.  Discharged Condition: stable  Hospital Course: Patient presented to [facility] on September 29, 2019 for elective I&D of right chronic heel wound and calcaneal exostectomy with application of wound VAC by Dr. Wylene Simmer.  He tolerated the procedure well without complication.  Intra-Op cultures were obtained.  The patient was admitted to the hospital.  He was prophylactically placed on vancomycin and Zosyn.  Infectious disease was consulted.  Cultures grew Pseudomonas and MRSA.  A PICC line was placed.  The patient was placed on 8 weeks of IV Rocephin and vancomycin her ID recommendations.  The patient worked well with therapy.  He tolerated his stay in the hospital well without complication.  Wound vac was removed and dress dressing with NWB splint reapplied prior to D/C. He was DC'd in stable condition back to assisted living through the [place] on October 02, 2019.  Consults: ID and PT/OT/case management  Significant Diagnostic Studies: microbiology: wound culture: positive for Pseudomonas and MRSA.  Treatments: IV hydration, antibiotics: Ancef, vancomycin, Zosyn and ceftriaxone, analgesia: acetaminophen, acetaminophen w/ codeine, Dilaudid and oxycodone, cardiac meds: lisinopril (Zestril), anticoagulation: ASA and xarelto and surgery: as stated above.  Discharge Exam: Blood pressure 136/89, pulse 69, temperature 97.6 F (36.4 C), temperature source Oral, resp. rate 18, height 6' (1.829 m), weight 97.5 kg, SpO2 97 %. General: WDWN patient in NAD. Psych:  Appropriate mood and affect. Neuro:  A&O x 3, Moving all  extremities, sensation intact to light touch HEENT:  EOMs intact Chest:  Even non-labored respirations Skin:  SLS C/D/I, no rashes or lesions Extremities: warm/dry, no visible edema, erythema or echymosis.  No lymphadenopathy. Pulses: Popliteus 2+ MSK:  ROM: EHL/FHL intact, MMT: able to perform quad set   Disposition: Discharge disposition: 01-Home or Self Care       Discharge Instructions    Call MD / Call 911   Complete by: As directed    If you experience chest pain or shortness of breath, CALL 911 and be transported to the hospital emergency room.  If you develope a fever above 101 F, pus (white drainage) or increased drainage or redness at the wound, or calf pain, call your surgeon's office.   Constipation Prevention   Complete by: As directed    Drink plenty of fluids.  Prune juice may be helpful.  You may use a stool softener, such as Colace (over the counter) 100 mg twice a day.  Use MiraLax (over the counter) for constipation as needed.   Diet - low sodium heart healthy   Complete by: As directed    Discharge instructions   Complete by: As directed    Xarelto and Aspirin 81 mg tabs Leave dressings on Follow up with Dr. Doran Durand in 2 weeks Nonweight bearing on right lower extremity   Face-to-face encounter (required for Medicare/Medicaid patients)   Complete by: As directed    I Drue Novel certify that this patient is under my care and that I, or a nurse practitioner or physician's assistant working with me, had a face-to-face encounter that meets the physician face-to-face encounter requirements with this patient on 10/02/2019. The encounter with the  patient was in whole, or in part for the following medical condition(s) which is the primary reason for home health care (List medical condition): right ankle osteomyelitis, with PICC line and IV abx   The encounter with the patient was in whole, or in part, for the following medical condition, which is the primary reason for  home health care: right ankle osteomyelitis   I certify that, based on my findings, the following services are medically necessary home health services: Nursing   Reason for Medically Necessary Home Health Services: Skilled Nursing- Assessment and Training for Infusion Therapy, Line Care, and Infection Control   My clinical findings support the need for the above services: OTHER SEE COMMENTS   Further, I certify that my clinical findings support that this patient is homebound due to: Unable to leave home safely without assistance   Home Health   Complete by: As directed    To provide the following care/treatments:  [place] infusion instructions   Complete by: As directed    Instructions: Flushing of vascular access device: 0.9% NaCl pre/post medication administration and prn patency; Heparin 100 u/ml, 1m for implanted ports and Heparin 10u/ml, 563mfor all other central venous catheters.   Increase activity slowly as tolerated   Complete by: As directed      Allergies as of 10/02/2019   No Known Allergies     Medication List    STOP taking these medications   oxyCODONE-acetaminophen 5-325 MG tablet Commonly known as: PERCOCET/ROXICET     TAKE these medications   acetaminophen 500 MG tablet Commonly known as: TYLENOL Take 1,000 mg by mouth in the morning and at bedtime.   amitriptyline 75 MG tablet Commonly known as: ELAVIL Take 75 mg by mouth at bedtime.   aspirin 81 MG EC tablet Take 1 tablet (81 mg total) by mouth daily at 6 (six) AM.   atorvastatin 10 MG tablet Commonly known as: Lipitor Take 1 tablet (10 mg total) by mouth daily.   carvedilol 3.125 MG tablet Commonly known as: COREG Take 1 tablet (3.125 mg total) by mouth 2 (two) times daily.   docusate sodium 100 MG capsule Commonly known as: Colace Take 1 capsule (100 mg total) by mouth daily as needed.   DULoxetine 60 MG capsule Commonly known as: CYMBALTA Take 60 mg by mouth daily.    ibuprofen 800 MG tablet Commonly known as: ADVIL Take 1 tablet (800 mg total) by mouth every 8 (eight) hours as needed.   lidocaine 5 % Commonly known as: LIDODERM Place 1 patch onto the skin daily. Remove & Discard patch within 12 hours or as directed by MD   lisinopril 20 MG tablet Commonly known as: ZESTRIL Take 1 tablet (20 mg total) by mouth daily.   meropenem  IVPB Commonly known as: MERREM Inject 1 g into the vein every 8 (eight) hours. Indication:  Osteomyelitis of right foot Last Day of Therapy:  11/25/2019 Labs - Once weekly:  CBC/D and BMP, Labs - Every other week:  ESR and CRP   oxyCODONE 5 MG immediate release tablet Commonly known as: Roxicodone Take 1 tablet (5 mg total) by mouth every 4 (four) hours as needed for up to 5 days for moderate pain or severe pain.   rivaroxaban 20 MG Tabs tablet Commonly known as: Xarelto Take 1 tablet (20 mg total) by mouth daily with supper.   senna 8.6 MG Tabs tablet Commonly known as: SENOKOT Take 2  tablets (17.2 mg total) by mouth 2 (two) times daily. What changed:   how much to take  when to take this   SilvaSorb Gel Apply 1 application topically 3 (three) times a week.   vancomycin  IVPB Inject 1,250 mg into the vein every 12 (twelve) hours. Indication:  osteomyelitis of right foot Last Day of Therapy:  11/25/2019 Labs - Sunday/Monday:  CBC/D, BMP, and vancomycin trough. Labs - Thursday:  BMP and vancomycin trough Labs - Every other week:  ESR and CRP            Home Infusion Instuctions  (From admission, onward)         Start     Ordered   10/02/19 0000  Home infusion instructions    Question:  Instructions  Answer:  Flushing of vascular access device: 0.9% NaCl pre/post medication administration and prn patency; Heparin 100 u/ml, 45m for implanted ports and Heparin 10u/ml, 550mfor all other central venous catheters.   10/02/19 1045         Follow-up Information    HeWylene SimmerMD. Schedule an  appointment as soon as possible for a visit in 2 weeks.   Specialty: Orthopedic Surgery Contact information: [phone]         Signed: JuCorky Sing/17/2021, 1:16 PM

## 2019-10-26 ENCOUNTER — Telehealth: Payer: Self-pay | Admitting: Infectious Diseases

## 2019-10-26 NOTE — Telephone Encounter (Signed)
OPAT labs received and reviewed - no follow up appointment has been set up.   I called Mitchell Richard and verified patient identity over the phone.   He tells me that he is currently getting his stitches out of his foot and that it is healing up very nicely on antibiotics.   He is receiving Vancomycin + Meropenem and sounds to be tolerating them well.   Appt scheduled with Dr. Drue Second for April 26 @ 10:30 am   Rexene Alberts, MSN, NP-C [facility]  [email or web address] Pager: [phone] Office: [phone] [facility] Main Line: [phone]

## 2019-11-14 ENCOUNTER — Other Ambulatory Visit: Payer: Self-pay

## 2019-11-14 ENCOUNTER — Ambulatory Visit (INDEPENDENT_AMBULATORY_CARE_PROVIDER_SITE_OTHER): Payer: Medicaid Other | Admitting: Internal Medicine

## 2019-11-14 ENCOUNTER — Encounter: Payer: Self-pay | Admitting: Internal Medicine

## 2019-11-14 VITALS — Wt 217.0 lb

## 2019-11-14 DIAGNOSIS — M86071 Acute hematogenous osteomyelitis, right ankle and foot: Secondary | ICD-10-CM | POA: Diagnosis not present

## 2019-11-14 NOTE — Progress Notes (Signed)
Patient ID: Mitchell Richard, male   DOB: [birth year], 59 y.o.   MRN: [id number]  HPI Mitchell Richard is a 59yo M who was hospitalized for Osteomyelitis of right ankle with ID recommendation to treat with vancomycin plus meropenem due to identified pseudomonas and GPC with hx of MRSA infection. He is to finish 8 wk of vancomycin plus meropenem to end on may 7th. With plans to change to doxycycline +/- FQ for suppression if inflammatory markers still elevated.  He is tolerating abtx, no difficulty with his picc line. No diarrhea or thrush.  Outpatient Encounter Medications as of 11/14/2019  Medication Sig  . acetaminophen (TYLENOL) 500 MG tablet Take 1,000 mg by mouth in the morning and at bedtime.  Marland Kitchen aspirin 81 MG EC tablet Take 1 tablet (81 mg total) by mouth daily at 6 (six) AM.  . atorvastatin (LIPITOR) 10 MG tablet Take 1 tablet (10 mg total) by mouth daily.  . carvedilol (COREG) 3.125 MG tablet Take 1 tablet (3.125 mg total) by mouth 2 (two) times daily.  Marland Kitchen docusate sodium (COLACE) 100 MG capsule Take 1 capsule (100 mg total) by mouth daily as needed.  . DULoxetine (CYMBALTA) 60 MG capsule Take 60 mg by mouth daily.  Marland Kitchen ibuprofen (ADVIL) 800 MG tablet Take 1 tablet (800 mg total) by mouth every 8 (eight) hours as needed.  . lidocaine (LIDODERM) 5 % Place 1 patch onto the skin daily. Remove & Discard patch within 12 hours or as directed by MD  . lisinopril (ZESTRIL) 20 MG tablet Take 1 tablet (20 mg total) by mouth daily.  . meropenem (MERREM) IVPB Inject 1 g into the vein every 8 (eight) hours. Indication:  Osteomyelitis of right foot Last Day of Therapy:  11/25/2019 Labs - Once weekly:  CBC/D and BMP, Labs - Every other week:  ESR and CRP  . rivaroxaban (XARELTO) 20 MG TABS tablet Take 1 tablet (20 mg total) by mouth daily with supper.  . senna (SENOKOT) 8.6 MG TABS tablet Take 2 tablets (17.2 mg total) by mouth 2 (two) times daily.  Cathie Olden (SILVASORB) GEL Apply 1 application topically 3 (three)  times a week.  . vancomycin IVPB Inject 1,250 mg into the vein every 12 (twelve) hours. Indication:  osteomyelitis of right foot Last Day of Therapy:  11/25/2019 Labs - Sunday/Monday:  CBC/D, BMP, and vancomycin trough. Labs - Thursday:  BMP and vancomycin trough Labs - Every other week:  ESR and CRP  . amitriptyline (ELAVIL) 75 MG tablet Take 75 mg by mouth at bedtime.   No facility-administered encounter medications on file as of 11/14/2019.     Patient Active Problem List   Diagnosis Date Noted  . History of MRSA infection   . Osteomyelitis of right foot ([place]) 09/29/2019  . PAD (peripheral artery disease) ([place]) 08/16/2019  . Critical lower limb ischemia 08/16/2019  . Hypertension   . 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 Ulcer of right heel, with necrosis of bone [facility])      Health Maintenance Due  Topic Date Due  . Hepatitis C Screening  Never done  . HIV Screening  Never done  . COVID-19 Vaccine (1) Never done  . TETANUS/TDAP  Never done  . COLONOSCOPY  Never done     Review of Systems Review of Systems  Constitutional: Negative for fever, chills, diaphoresis, activity change, appetite change, fatigue and unexpected weight change.  HENT: Negative for congestion,  sore throat, rhinorrhea, sneezing, trouble swallowing and sinus pressure.  Eyes: Negative for photophobia and visual disturbance.  Respiratory: Negative for cough, chest tightness, shortness of breath, wheezing and stridor.  Cardiovascular: Negative for chest pain, palpitations and leg swelling.  Gastrointestinal: Negative for nausea, vomiting, abdominal pain, diarrhea, constipation, blood in stool, abdominal distention and anal bleeding.  Genitourinary: Negative for dysuria, hematuria, flank pain and difficulty urinating.  Musculoskeletal: Negative for myalgias, back pain, joint swelling, arthralgias and gait problem.  Skin: Negative for color change, pallor, rash and wound.   Neurological: Negative for dizziness, tremors, weakness and light-headedness.  Hematological: Negative for adenopathy. Does not bruise/bleed easily.  Psychiatric/Behavioral: Negative for behavioral problems, confusion, sleep disturbance, dysphoric mood, decreased concentration and agitation.    Physical Exam   Wt 217 lb (98.4 kg)   BMI 29.43 kg/m    Physical Exam  Constitutional: He is oriented to person, place, and time. He appears well-developed and well-nourished. No distress.  HENT:  Mouth/Throat: Oropharynx is clear and moist. No oropharyngeal exudate.  Cardiovascular: Normal rate, regular rhythm and normal heart sounds. Exam reveals no gallop and no friction rub.  No murmur heard.  Pulmonary/Chest: Effort normal and breath sounds normal. No respiratory distress. He has no wheezes.  Abdominal: Soft. Bowel sounds are normal. He exhibits no distension. There is no tenderness.  Lymphadenopathy:  He has no cervical adenopathy.  Neurological: He is alert and oriented to person, place, and time.  Skin: Skin is warm and dry. No rash noted. No erythema.  Psychiatric: He has a normal mood and affect. His behavior is normal.    CBC Lab Results  Component Value Date   WBC 12.4 (H) 09/30/2019   RBC 4.40 09/30/2019   HGB 13.0 09/30/2019   HCT 38.4 (L) 09/30/2019   PLT 227 09/30/2019   MCV 87.3 09/30/2019   MCH 29.5 09/30/2019   MCHC 33.9 09/30/2019   RDW 14.6 09/30/2019   LYMPHSABS 1.4 04/20/2019   MONOABS 0.5 04/20/2019   EOSABS 0.1 04/20/2019    BMET Lab Results  Component Value Date   NA 140 09/30/2019   K 3.8 09/30/2019   CL 108 09/30/2019   CO2 23 09/30/2019   GLUCOSE 84 09/30/2019   BUN 11 09/30/2019   CREATININE 0.98 09/30/2019   CALCIUM 8.9 09/30/2019   GFRNONAA >60 09/30/2019   GFRAA >60 09/30/2019      Assessment and Plan Ankle osteomyelitis =   doing well thus far, wearing boot to right ankle, sees hewitt in 3 wk Continue with iv therapy then  switch to doxycycline after completing 8wk of iv therapy  Therapeutic drug monitoring = tolerating vancomycin per labs
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
## 2019-11-28 ENCOUNTER — Telehealth: Payer: Self-pay | Admitting: Pharmacist

## 2019-11-28 ENCOUNTER — Ambulatory Visit: Payer: Medicaid Other | Admitting: Cardiovascular Disease

## 2019-11-28 NOTE — Telephone Encounter (Addendum)
Spoke to Shevlin at [facility] and gave verbal order to pull patient's PICC today via Dr. Storm Frisk last note on 4/26 and OPAT orders at discharge. [name] verbalized understanding.   Patient's CRP is 20 from 11/21/2019. Unfortunately, ESR could not be done due to "no lavender top tube submitted".

## 2019-11-30 ENCOUNTER — Ambulatory Visit: Payer: Medicaid Other | Admitting: Internal Medicine
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
## 2019-12-02 ENCOUNTER — Ambulatory Visit: Payer: Medicaid Other | Admitting: Cardiovascular Disease

## 2019-12-14 ENCOUNTER — Encounter (HOSPITAL_COMMUNITY): Payer: Medicaid Other

## 2019-12-14 ENCOUNTER — Other Ambulatory Visit: Payer: Self-pay

## 2019-12-14 ENCOUNTER — Ambulatory Visit: Payer: Medicaid Other | Admitting: Vascular Surgery

## 2019-12-14 ENCOUNTER — Encounter: Payer: Self-pay | Admitting: Vascular Surgery

## 2019-12-14 ENCOUNTER — Ambulatory Visit (HOSPITAL_COMMUNITY)
Admission: RE | Admit: 2019-12-14 | Discharge: 2019-12-14 | Disposition: A | Discharge status: Home / Self Care | Payer: Medicaid Other | Source: Ambulatory Visit | Attending: Vascular Surgery | Admitting: Vascular Surgery

## 2019-12-14 VITALS — BP 154/116 | HR 78 | Temp 97.8°F | Resp 20 | Ht 72.0 in | Wt 200.0 lb

## 2019-12-14 DIAGNOSIS — I739 Peripheral vascular disease, unspecified: Secondary | ICD-10-CM

## 2019-12-14 DIAGNOSIS — I70219 Atherosclerosis of native arteries of extremities with intermittent claudication, unspecified extremity: Secondary | ICD-10-CM | POA: Diagnosis present

## 2019-12-14 NOTE — Progress Notes (Signed)
REASON FOR VISIT:   Follow-up after right femoropopliteal bypass  MEDICAL ISSUES:   CRITICAL LIMB ISCHEMIA: The patient is undergone successful revascularization of the right lower extremity. He had a right femoral to above-knee popliteal artery bypass with vein. The graft is patent with triphasic flow throughout. I cannot palpate pedal pulses however the right foot is warm and well perfused. ABIs were denied by his insurance company. Hopefully we can get follow-up ABIs when he returns in 6 months. We have again discussed the importance of tobacco cessation.  HPI:   Mitchell Richard is a pleasant 59 y.o. male who had presented with a nonhealing wound of his Achilles.  He had a superficial femoral artery occlusion on the right and presented for revascularization.  On 08/16/2019 he underwent a right femoral to above-knee popliteal artery bypass with vein.  He comes in for routine follow-up visit.  Patient denies any significant claudication. The wound on his Achilles has healed. He denies any rest pain. He does continue to smo some.  Of note the patient does have known infrainguinal arterial occlusive disease on the left. November 2020 had an ABI of 75% with monophasic Doppler signals in both feet and a toe pressure of 60.  Past Medical History:  Diagnosis Date  . Arthritis   . 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 Hypertension   . Osteomyelitis (HCC)    right calcaneus  . PAD (peripheral artery disease) (HCC)   . PTSD (post-traumatic stress disorder)   . Ulcer of right heel, with necrosis of bone (HCC)     Family History  Problem Relation Age of Onset  . Peripheral Artery Disease Mother   . Stroke Father     SOCIAL HISTORY: Social History   Tobacco Use  . Smoking status: Current Every Day Smoker    Packs/day: 0.50    Years: 40.00    Pack years: 20.00    Types: Cigarettes  . Smokeless tobacco: Never Used  Substance Use Topics  . Alcohol use:  Not Currently    No Known Allergies  Current Outpatient Medications  Medication Sig Dispense Refill  . acetaminophen (TYLENOL) 500 MG tablet Take 1,000 mg by mouth in the morning and at bedtime.    Marland Kitchen amitriptyline (ELAVIL) 75 MG tablet Take 75 mg by mouth at bedtime.    Marland Kitchen aspirin 81 MG EC tablet Take 1 tablet (81 mg total) by mouth daily at 6 (six) AM. 90 tablet 1  . atorvastatin (LIPITOR) 10 MG tablet Take 1 tablet (10 mg total) by mouth daily. 90 tablet 3  . docusate sodium (COLACE) 100 MG capsule Take 1 capsule (100 mg total) by mouth daily as needed. 30 capsule 2  . DULoxetine (CYMBALTA) 60 MG capsule Take 60 mg by mouth daily.    Marland Kitchen ibuprofen (ADVIL) 800 MG tablet Take 1 tablet (800 mg total) by mouth every 8 (eight) hours as needed. 21 tablet 0  . lidocaine (LIDODERM) 5 % Place 1 patch onto the skin daily. Remove & Discard patch within 12 hours or as directed by MD    . lisinopril (ZESTRIL) 20 MG tablet Take 1 tablet (20 mg total) by mouth daily. 90 tablet 3  . rivaroxaban (XARELTO) 20 MG TABS tablet Take 1 tablet (20 mg total) by mouth daily with supper. 90 tablet 0  . senna (SENOKOT) 8.6 MG TABS tablet Take 2 tablets (17.2 mg total) by mouth 2 (two) times  daily. 30 tablet 0  . Silver (SILVASORB) GEL Apply 1 application topically 3 (three) times a week.    . carvedilol (COREG) 3.125 MG tablet Take 1 tablet (3.125 mg total) by mouth 2 (two) times daily. 180 tablet 3   No current facility-administered medications for this visit.    REVIEW OF SYSTEMS:  [X]  denotes positive finding, [ ]  denotes negative finding Cardiac  Comments:  Chest pain or chest pressure:    Shortness of breath upon exertion:    Short of breath when lying flat:    Irregular heart rhythm:        Vascular    Pain in calf, thigh, or hip brought on by ambulation:    Pain in feet at night that wakes you up from your sleep:     Blood clot in your veins:    Leg swelling:         Pulmonary    Oxygen at home:     Productive cough:     Wheezing:         Neurologic    Sudden weakness in arms or legs:     Sudden numbness in arms or legs:     Sudden onset of difficulty speaking or slurred speech:    Temporary loss of vision in one eye:     Problems with dizziness:         Gastrointestinal    Blood in stool:     Vomited blood:         Genitourinary    Burning when urinating:     Blood in urine:        Psychiatric    Major depression:         Hematologic    Bleeding problems:    Problems with blood clotting too easily:        Skin    Rashes or ulcers:        Constitutional    Fever or chills:     PHYSICAL EXAM:   Vitals:   12/14/19 1032  BP: (!) 154/116  Pulse: 78  Resp: 20  Temp: 97.8 F (36.6 C)  SpO2: 98%  Weight: 200 lb (90.7 kg)  Height: 6' (1.829 m)    GENERAL: The patient is a well-nourished male, in no acute distress. The vital signs are documented above. CARDIAC: There is a regular rate and rhythm.  VASCULAR: I do not detect carotid bruits. He has palpable femoral pulses bilaterally. I cannot palpate a popliteal or pedal pulses on either side. PULMONARY: There is good air exchange bilaterally without wheezing or rales. ABDOMEN: Soft and non-tender with normal pitched bowel sounds.  MUSCULOSKELETAL: There are no major deformities or cyanosis. NEUROLOGIC: No focal weakness or paresthesias are detected. SKIN: The wound on his Achilles has healed. PSYCHIATRIC: The patient has a normal affect.  DATA:    DUPLEX RIGHT LOWER EXTREMITY BYPASS: I have independently interpreted the duplex of his right lower extremity bypass.  The bypass graft is patent without any areas of stenosis noted.  There is triphasic flow throughout the bypass.  Deitra Mayo [facility] [phone]

## 2019-12-15 ENCOUNTER — Other Ambulatory Visit: Payer: Self-pay | Admitting: *Deleted

## 2019-12-15 DIAGNOSIS — I739 Peripheral vascular disease, unspecified: Secondary | ICD-10-CM

## 2019-12-28 ENCOUNTER — Ambulatory Visit: Payer: Medicaid Other | Admitting: Internal Medicine

## 2020-01-28 ENCOUNTER — Encounter (HOSPITAL_COMMUNITY): Payer: Self-pay

## 2020-01-28 ENCOUNTER — Other Ambulatory Visit: Payer: Self-pay

## 2020-01-28 ENCOUNTER — Ambulatory Visit (HOSPITAL_COMMUNITY)
Admission: EM | Admit: 2020-01-28 | Discharge: 2020-01-28 | Disposition: A | Discharge status: Home / Self Care | Payer: Medicaid Other | Attending: Emergency Medicine | Admitting: Emergency Medicine

## 2020-01-28 DIAGNOSIS — J069 Acute upper respiratory infection, unspecified: Secondary | ICD-10-CM | POA: Diagnosis not present

## 2020-01-28 MED ORDER — ALBUTEROL SULFATE HFA 108 (90 BASE) MCG/ACT IN AERS: 1.0000 | INHALATION_SPRAY | Freq: Four times a day (QID) | RESPIRATORY_TRACT | 0 refills | Status: AC | PRN

## 2020-01-28 MED ORDER — BENZONATATE 100 MG PO CAPS: 100.0000 mg | ORAL_CAPSULE | Freq: Three times a day (TID) | ORAL | 0 refills | Status: AC

## 2020-01-28 NOTE — Discharge Instructions (Signed)
Push fluids to ensure adequate hydration and keep secretions thin.  Tylenol as needed for pain or headache.  Mucinex as an expectorant.  Decrease to quit smoking as this can prolong your cough symptoms.  Use of inhaler as needed for wheezing or shortness of breath.   Tessalon as needed for cough.  If symptoms worsen or do not improve in the next week to return to be seen or to follow up with your PCP.

## 2020-01-28 NOTE — ED Triage Notes (Signed)
Pt present cough and nasal congestion for 3 or four days. Pt denies any other symptoms at this time.
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
## 2020-08-02 ENCOUNTER — Other Ambulatory Visit: Payer: Self-pay | Admitting: Physical Medicine & Rehabilitation

## 2020-08-02 DIAGNOSIS — M25552 Pain in left hip: Secondary | ICD-10-CM

## 2020-08-20 ENCOUNTER — Other Ambulatory Visit: Payer: Medicaid Other

## 2020-08-23 ENCOUNTER — Other Ambulatory Visit: Payer: Medicaid Other

## 2020-09-16 IMAGING — MR MR CARD MORPHOLOGY WO/W CM
46 of 48 series · 46 of 48 positions shown · IV contrast (Contrast agent)
Comparison: none

CLINICAL DATA: ?Noncompaction cardiomyopathy.

EXAM:
CARDIAC MRI
TECHNIQUE: The patient was scanned on a 1.5 Tesla GE magnet. A dedicated
cardiac coil was used. Functional imaging was done using Fiesta
sequences. [DATE], and 4 chamber views were done to assess for RWMA's.
Modified Daja rule using a short axis stack was used to
calculate an ejection fraction on a dedicated work station using
Circle software. The patient received 10 cc of Gadavist. After 10
minutes inversion recovery sequences were used to assess for
infiltration and scar tissue.

[Series 7: bSSFP · oblique · 8.0mm · 1.61mm/px · 1 of 25 slices shown (1 of 20)]
[im 1/25]
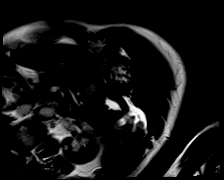

[Series 7: bSSFP · oblique · 8.0mm · 1.61mm/px · 1 of 25 slices shown (2 of 20)]
[im 1/25]
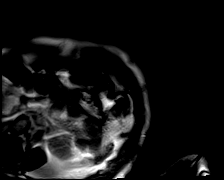

[Series 7: bSSFP · oblique · 8.0mm · 1.61mm/px · 1 of 25 slices shown (3 of 20)]
[im 1/25]
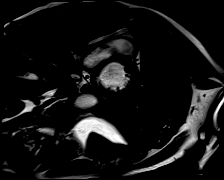

[Series 7: bSSFP · oblique · 8.0mm · 1.61mm/px · 1 of 25 slices shown (4 of 20)]
[im 1/25]
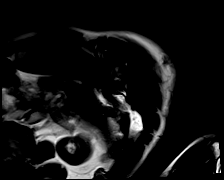

[Series 7: bSSFP · oblique · 8.0mm · 1.61mm/px · 1 of 25 slices shown (5 of 20)]
[im 1/25]
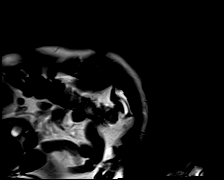

[Series 7: bSSFP · oblique · 8.0mm · 1.61mm/px · 1 of 25 slices shown (6 of 20)]
[im 1/25]
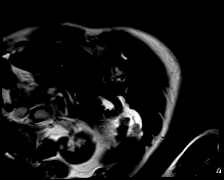

[Series 7: bSSFP · oblique · 8.0mm · 1.61mm/px · 1 of 25 slices shown (7 of 20)]
[im 1/25]
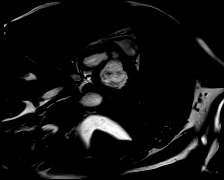

[Series 7: bSSFP · oblique · 8.0mm · 1.61mm/px · 1 of 25 slices shown (8 of 20)]
[im 1/25]
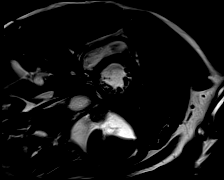

[Series 7: bSSFP · oblique · 8.0mm · 1.61mm/px · 1 of 25 slices shown (9 of 20)]
[im 1/25]
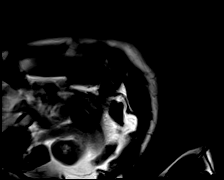

[Series 7: bSSFP · oblique · 8.0mm · 1.61mm/px · 1 of 25 slices shown (10 of 20)]
[im 1/25]
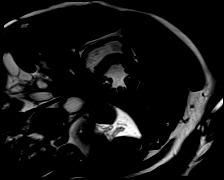

[Series 7: bSSFP · oblique · 8.0mm · 1.61mm/px · 1 of 25 slices shown (11 of 20)]
[im 1/25]
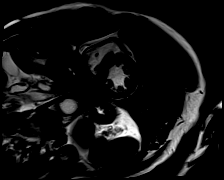

[Series 7: bSSFP · oblique · 8.0mm · 1.61mm/px · 1 of 25 slices shown (12 of 20)]
[im 1/25]
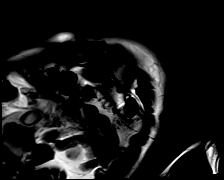

[Series 7: bSSFP · oblique · 8.0mm · 1.61mm/px · 1 of 25 slices shown (13 of 20)]
[im 1/25]
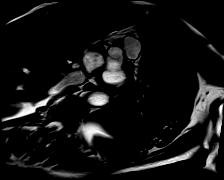

[Series 7: bSSFP · oblique · 8.0mm · 1.61mm/px · 1 of 25 slices shown (14 of 20)]
[im 1/25]
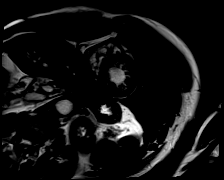

[Series 7: bSSFP · oblique · 8.0mm · 1.61mm/px · 1 of 25 slices shown (15 of 20)]
[im 1/25]
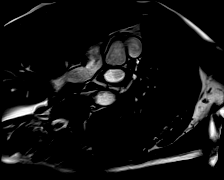

[Series 7: bSSFP · oblique · 8.0mm · 1.61mm/px · 1 of 25 slices shown (16 of 20)]
[im 1/25]
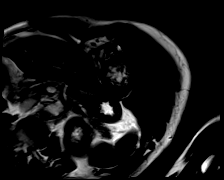

[Series 7: bSSFP · oblique · 8.0mm · 1.61mm/px · 1 of 25 slices shown (17 of 20)]
[im 1/25]
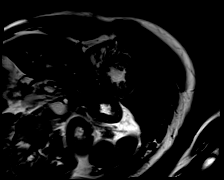

[Series 8: t2_stir_db_sax · oblique · 8.0mm · 1.73mm/px · 1 of 17 slices shown]
[im 1/17]
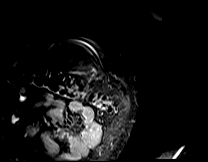

[Series 9: t2_stir_db_radial ((date)ch) · oblique · 6.0mm · 1.73mm/px · 1 of 3 slices shown (1 of 2)]
[im 1/3]
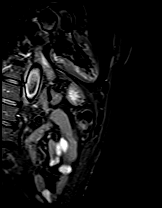

[Series 10: (id)_long_t1 · oblique · 8.0mm · 1.41mm/px · 1 of 24 slices shown]
[im 1/24]
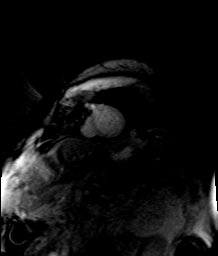

[Series 11: (id)_long_t1_moco · oblique · 8.0mm · 1.41mm/px · 1 of 24 slices shown]
[im 1/24]
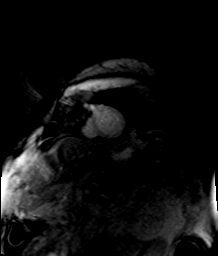

[Series 12: (id)_long_t1_moco_t1 · oblique · 8.0mm · 1.41mm/px · 1 of 6 slices shown]
[im 1/6]
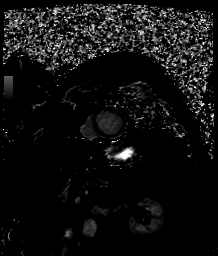

[Series 14: (id)_trufi · oblique · 8.0mm · 1.88mm/px · 1 of 9 slices shown]
[im 1/9]
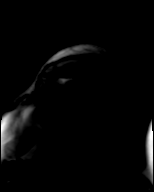

[Series 15: (id)_trufi_moco · oblique · 8.0mm · 1.88mm/px · 1 of 9 slices shown]
[im 1/9]
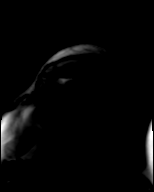

[Series 16: (id)_trufi_moco_t2 · oblique · 8.0mm · 1.88mm/px · 1 of 3 slices shown]
[im 1/3]
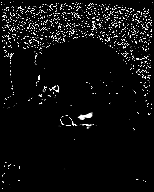

[Series 18: bSSFP · coronal · 6.0mm · 1.41mm/px · 1 of 25 slices shown (18 of 20)]
[im 1/25]
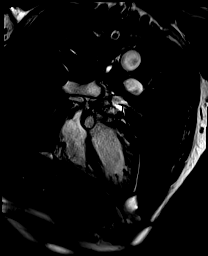

[Series 19: bSSFP · oblique · 6.0mm · 1.41mm/px · 1 of 25 slices shown (19 of 20)]
[im 1/25]
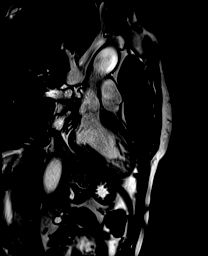

[Series 20: bSSFP · sagittal · 6.0mm · 1.41mm/px · 1 of 25 slices shown (20 of 20)]
[im 1/25]
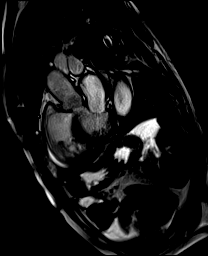

[Series 21: t2_stir_db_radial ((date)ch) · coronal · 6.0mm · 1.73mm/px · 1 of 3 slices shown (2 of 2)]
[im 1/3]
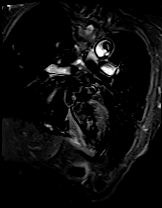

[Series 22: cine real time · oblique · 8.0mm · 2.73mm/px · 1 of 20 slices shown (1 of 17)]
[im 1/20]
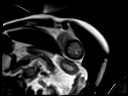

[Series 22: cine real time · oblique · 8.0mm · 2.73mm/px · 1 of 13 slices shown (2 of 17)]
[im 1/13]
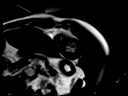

[Series 22: cine real time · oblique · 8.0mm · 2.73mm/px · 1 of 20 slices shown (3 of 17)]
[im 1/20]
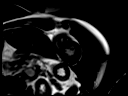

[Series 23: cine real time · oblique · 8.0mm · 2.73mm/px · 1 of 20 slices shown (4 of 17)]
[im 1/20]
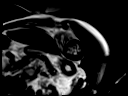

[Series 23: cine real time · oblique · 8.0mm · 2.73mm/px · 1 of 20 slices shown (5 of 17)]
[im 1/20]
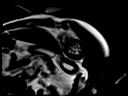

[Series 23: cine real time · oblique · 8.0mm · 2.73mm/px · 1 of 20 slices shown (6 of 17)]
[im 1/20]
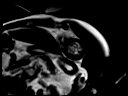

[Series 23: cine real time · oblique · 8.0mm · 2.73mm/px · 1 of 20 slices shown (7 of 17)]
[im 1/20]
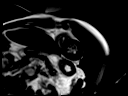

[Series 23: cine real time · oblique · 8.0mm · 2.73mm/px · 1 of 20 slices shown (8 of 17)]
[im 1/20]
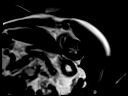

[Series 23: cine real time · oblique · 8.0mm · 2.73mm/px · 1 of 20 slices shown (9 of 17)]
[im 1/20]
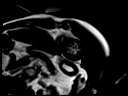

[Series 23: cine real time · oblique · 8.0mm · 2.73mm/px · 1 of 20 slices shown (10 of 17)]
[im 1/20]
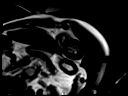

[Series 23: cine real time · oblique · 8.0mm · 2.73mm/px · 1 of 20 slices shown (11 of 17)]
[im 1/20]
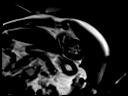

[Series 23: cine real time · oblique · 8.0mm · 2.73mm/px · 1 of 20 slices shown (12 of 17)]
[im 1/20]
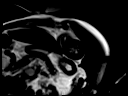

[Series 23: cine real time · oblique · 8.0mm · 2.73mm/px · 1 of 20 slices shown (13 of 17)]
[im 1/20]
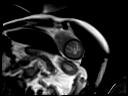

[Series 23: cine real time · oblique · 8.0mm · 2.73mm/px · 1 of 20 slices shown (14 of 17)]
[im 1/20]
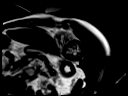

[Series 23: cine real time · oblique · 8.0mm · 2.73mm/px · 1 of 20 slices shown (15 of 17)]
[im 1/20]
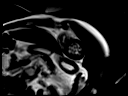

[Series 23: cine real time · oblique · 8.0mm · 2.73mm/px · 1 of 20 slices shown (16 of 17)]
[im 1/20]
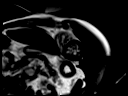

[Series 23: cine real time · oblique · 8.0mm · 2.73mm/px · 1 of 20 slices shown (17 of 17)]
[im 1/20]
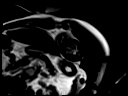

[46 of 48 positions shown; findings below may reference images not displayed]

FINDINGS: Limited images of the lung fields showed no gross abnormalities.

Normal left ventricular size and wall thickness. Basal inferior
severe hypokinesis, mild global hypokinesis. Overall LVEF calculated
to be 43%. Very prominent trabeculations in the mid left ventricle
and at the apex, end-diastolic noncompacted:compacted ratio 3.4 in
the mid ventricle. No LV thrombus noted. Normal right ventricular
size and systolic function, EF 48%. Normal left and right atrial
sizes. No significant mitral regurgitation noted. Trileaflet aortic
valve with no stenosis or regurgitation.

On delayed enhancement imaging, there was 76-99% wall thickness
subendocardial late gadolinium enhancement (LGE) in the basal
inferior wall.

Measurements:

LVEDV 149 mL

LVSV 64 mL

LVEF 43%

RVEDV 88 mL
RVSV 42 mL
RVEF 48%
IMPRESSION: 1. Normal LV size with EF 43%, global mild hypokinesis with basal
inferior severe hypokinesis. Prominent trabeculation pattern fitting
criteria for LV noncompaction.

2.  Normal RV size and systolic function, EF 48%.

3. On delayed enhancement imaging, there was coronary-pattern LGE in
the basal inferior wall.

Suspect prior inferior MI (minimal viability given >75% wall
thickness subendocardial LGE) co-existing with LV noncompaction.

Eliza Guisella Betun

## 2020-11-09 ENCOUNTER — Other Ambulatory Visit: Payer: Medicaid Other

## 2020-11-10 ENCOUNTER — Inpatient Hospital Stay: Admission: RE | Admit: 2020-11-10 | Payer: Self-pay | Source: Ambulatory Visit

## 2021-01-23 ENCOUNTER — Other Ambulatory Visit: Payer: Self-pay

## 2021-01-23 ENCOUNTER — Encounter (HOSPITAL_COMMUNITY): Payer: Self-pay

## 2021-01-23 ENCOUNTER — Emergency Department (HOSPITAL_COMMUNITY)
Admission: EM | Admit: 2021-01-23 | Discharge: 2021-01-23 | Disposition: A | Discharge status: Home / Self Care | Payer: Medicaid Other | Attending: Emergency Medicine | Admitting: Emergency Medicine

## 2021-01-23 DIAGNOSIS — M25552 Pain in left hip: Secondary | ICD-10-CM | POA: Diagnosis not present

## 2021-01-23 DIAGNOSIS — S60451A Superficial foreign body of left index finger, initial encounter: Secondary | ICD-10-CM | POA: Insufficient documentation

## 2021-01-23 DIAGNOSIS — F17210 Nicotine dependence, cigarettes, uncomplicated: Secondary | ICD-10-CM | POA: Diagnosis not present

## 2021-01-23 DIAGNOSIS — Z79899 Other long term (current) drug therapy: Secondary | ICD-10-CM | POA: Insufficient documentation

## 2021-01-23 DIAGNOSIS — F1490 Cocaine use, unspecified, uncomplicated: Secondary | ICD-10-CM | POA: Diagnosis not present

## 2021-01-23 DIAGNOSIS — Z7982 Long term (current) use of aspirin: Secondary | ICD-10-CM | POA: Insufficient documentation

## 2021-01-23 DIAGNOSIS — W458XXA Other foreign body or object entering through skin, initial encounter: Secondary | ICD-10-CM | POA: Diagnosis not present

## 2021-01-23 DIAGNOSIS — F1990 Other psychoactive substance use, unspecified, uncomplicated: Secondary | ICD-10-CM

## 2021-01-23 DIAGNOSIS — I10 Essential (primary) hypertension: Secondary | ICD-10-CM | POA: Insufficient documentation

## 2021-01-23 DIAGNOSIS — Z7901 Long term (current) use of anticoagulants: Secondary | ICD-10-CM | POA: Insufficient documentation

## 2021-01-23 DIAGNOSIS — S6992XA Unspecified injury of left wrist, hand and finger(s), initial encounter: Secondary | ICD-10-CM

## 2021-01-23 MED ORDER — LIDOCAINE HCL 2 % IJ SOLN
20.0000 mL | Freq: Once | INTRAMUSCULAR | Status: AC
  Administered 2021-01-23: 400 mg via INTRADERMAL
  Filled 2021-01-23: qty 20

## 2021-01-23 NOTE — ED Notes (Signed)
Brought pt sandwich and juice.

## 2021-01-23 NOTE — ED Triage Notes (Addendum)
Patient arrived via [facility]. Patient is homeless.   Patient reports fish hook in his finger.   Also reports he uses cocaine and wants rehab.    A/ox4 Ambulatory in triage.   [id number]

## 2021-01-23 NOTE — ED Notes (Signed)
Put bandaid on pt finger, brought pt more water, pt provided w wound care instructions and substantial resources on outpatient substance abuse recovery programs. Pt ambulates independently, steady gait, A&O X4
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
## 2021-01-23 NOTE — Discharge Instructions (Signed)
Please read and follow all provided instructions.  Your diagnoses today include:  1. Fish hook injury of finger of left hand, initial encounter   2. Drug use     Tests performed today include: Vital signs. See below for your results today.   Medications prescribed:  None  Take any prescribed medications only as directed.   Home care instructions:  Follow any educational materials, substance abuse referrals, and wound care instructions contained in this packet.   Keep affected area above the level of your heart when possible to minimize swelling. Wash area gently twice a day with warm soapy water. Do not apply alcohol or hydrogen peroxide. Cover the area if it draining or weeping.   Return instructions:  Return to the Emergency Department if you have: Fever Worsening pain Worsening swelling of the wound Pus draining from the wound Redness of the skin that moves away from the wound, especially if it streaks away from the affected area  Any other emergent concerns  Your vital signs today were: BP 117/83 (BP Location: Right Arm)   Pulse 73   Temp 97.9 F (36.6 C) (Oral)   Resp 16   Ht 6\' 1"  (1.854 m)   Wt 86.2 kg   SpO2 99%   BMI 25.07 kg/m  If your blood pressure (BP) was elevated above 135/85 this visit, please have this repeated by your doctor within one month. --------------

## 2021-05-21 DEATH — deceased
